# Patient Record
Sex: Female | Born: 1970
Health system: Southern US, Community
[De-identification: ages and names within clinical notes are randomized; demographics above are authoritative.]

## PROBLEM LIST (undated history)

## (undated) DIAGNOSIS — R569 Unspecified convulsions: Secondary | ICD-10-CM

## (undated) DIAGNOSIS — R6250 Unspecified lack of expected normal physiological development in childhood: Secondary | ICD-10-CM

## (undated) HISTORY — DX: Unspecified convulsions: R56.9

## (undated) HISTORY — DX: Unspecified lack of expected normal physiological development in childhood: R62.50

---

## 2015-04-25 DIAGNOSIS — G40309 Generalized idiopathic epilepsy and epileptic syndromes, not intractable, without status epilepticus: Secondary | ICD-10-CM | POA: Diagnosis not present

## 2015-04-25 DIAGNOSIS — G43709 Chronic migraine without aura, not intractable, without status migrainosus: Secondary | ICD-10-CM | POA: Diagnosis not present

## 2015-05-10 DIAGNOSIS — Z1231 Encounter for screening mammogram for malignant neoplasm of breast: Secondary | ICD-10-CM | POA: Diagnosis not present

## 2015-05-10 DIAGNOSIS — Z803 Family history of malignant neoplasm of breast: Secondary | ICD-10-CM | POA: Diagnosis not present

## 2015-12-14 DIAGNOSIS — J309 Allergic rhinitis, unspecified: Secondary | ICD-10-CM | POA: Diagnosis not present

## 2015-12-14 DIAGNOSIS — G40909 Epilepsy, unspecified, not intractable, without status epilepticus: Secondary | ICD-10-CM | POA: Diagnosis not present

## 2015-12-14 DIAGNOSIS — R4183 Borderline intellectual functioning: Secondary | ICD-10-CM | POA: Diagnosis not present

## 2015-12-14 DIAGNOSIS — Z23 Encounter for immunization: Secondary | ICD-10-CM | POA: Diagnosis not present

## 2016-06-06 ENCOUNTER — Encounter (INDEPENDENT_AMBULATORY_CARE_PROVIDER_SITE_OTHER): Payer: Self-pay | Admitting: Physician Assistant

## 2016-06-06 ENCOUNTER — Ambulatory Visit (INDEPENDENT_AMBULATORY_CARE_PROVIDER_SITE_OTHER): Payer: Medicare Other | Admitting: Physician Assistant

## 2016-06-06 VITALS — BP 119/74 | HR 73 | Temp 98.7°F | Ht 64.57 in | Wt 141.4 lb

## 2016-06-06 DIAGNOSIS — Z760 Encounter for issue of repeat prescription: Secondary | ICD-10-CM

## 2016-06-06 DIAGNOSIS — R569 Unspecified convulsions: Secondary | ICD-10-CM

## 2016-06-06 MED ORDER — DIVALPROEX SODIUM 500 MG PO DR TAB: 500.0000 mg | DELAYED_RELEASE_TABLET | Freq: Two times a day (BID) | ORAL | 1 refills | Status: DC

## 2016-06-06 MED ORDER — OXCARBAZEPINE 600 MG PO TABS: 600.0000 mg | ORAL_TABLET | Freq: Two times a day (BID) | ORAL | 1 refills | Status: DC

## 2016-06-06 MED ORDER — LEVETIRACETAM 1000 MG PO TABS: 1000.0000 mg | ORAL_TABLET | Freq: Two times a day (BID) | ORAL | 1 refills | Status: DC

## 2016-06-06 NOTE — Progress Notes (Signed)
  Subjective:  Patient ID: Rebecca Gregory, female    DOB: 06/27/1970  Age: 46 y.o. MRN: [id number]  CC: establish care   HPI Rebecca Gregory is a 46 y.o. female with a reported hx of Grand Mal seizures presents to establish care. Pt is accompanied by mother and father. [place] have [place]. Pt previously followed by neurologist regularly but will now need a referral to a neurologist here. They would also like a refill of her medications. Father states the combination of Keppra, Trileptal, and Depakote have drastically reduced seizures. Most patient would experience while on these medications is a petite mal seizure of which patient is aware of having. There are currently no other complaints or symptoms.   Review of Systems  Constitutional: Negative for chills, fever and malaise/fatigue.  Eyes: Negative for blurred vision.  Respiratory: Negative for shortness of breath.   Cardiovascular: Negative for chest pain and palpitations.  Gastrointestinal: Negative for abdominal pain and nausea.  Genitourinary: Negative for dysuria and hematuria.  Musculoskeletal: Negative for joint pain and myalgias.  Skin: Negative for rash.  Neurological: Positive for seizures. Negative for tingling and headaches.  Psychiatric/Behavioral: Negative for depression. The patient is not nervous/anxious.     Objective:  BP 119/74 (BP Location: Right Arm, Patient Position: Sitting, Cuff Size: Normal)   Pulse 73   Temp 98.7 F (37.1 C) (Oral)   Ht 5' 4.57" (1.64 m)   Wt 141 lb 6.4 oz (64.1 kg)   LMP 05/09/2016 (Exact Date)   SpO2 100%   BMI 23.85 kg/m   BP/Weight 05/10/6008  Systolic BP 932  Diastolic BP 74  Wt. (Lbs) 141.4  BMI 23.85      Physical Exam  Constitutional: She is oriented to person, place, and time.  Well developed, well nourished, NAD, polite,   HENT:  Head: Normocephalic and atraumatic.  Eyes: No scleral icterus.  Neck: Normal range of motion. Neck supple. No  thyromegaly present.  Cardiovascular: Normal rate, regular rhythm and normal heart sounds.   Pulmonary/Chest: Effort normal and breath sounds normal.  Abdominal: Soft. Bowel sounds are normal. There is no tenderness.  Musculoskeletal: She exhibits no edema.  Neurological: She is alert and oriented to person, place, and time. Coordination normal.  mild cognitive impairment. Muscular tone and strength mildly decreased in UEs and LEs. Mild to moderate impairment of speech. Patellar DTR 1+ bilaterally.  Skin: Skin is warm and dry. No rash noted. No erythema. No pallor.  Psychiatric: She has a normal mood and affect. Her behavior is normal. Thought content normal.  Vitals reviewed.    Assessment & Plan:   1. Seizures ([place]) - Seemingly well controlled on Keppra, Trileptal, and Depakote. - CBC with Differential - Comprehensive metabolic panel - Ambulatory referral to Neurology  2. Medication refill    Follow-up:  4 weeks for full physical  Clent Demark PA

## 2016-06-20 ENCOUNTER — Encounter (INDEPENDENT_AMBULATORY_CARE_PROVIDER_SITE_OTHER): Payer: Self-pay | Admitting: Physician Assistant

## 2016-06-20 ENCOUNTER — Other Ambulatory Visit (HOSPITAL_COMMUNITY)
Admission: RE | Admit: 2016-06-20 | Discharge: 2016-06-20 | Disposition: A | Discharge status: Home / Self Care | Payer: Medicare Other | Source: Ambulatory Visit | Attending: Physician Assistant | Admitting: Physician Assistant

## 2016-06-20 ENCOUNTER — Ambulatory Visit (INDEPENDENT_AMBULATORY_CARE_PROVIDER_SITE_OTHER): Payer: Medicare Other | Admitting: Physician Assistant

## 2016-06-20 VITALS — BP 124/73 | HR 63 | Temp 97.5°F | Ht 64.0 in | Wt 144.0 lb

## 2016-06-20 DIAGNOSIS — Z23 Encounter for immunization: Secondary | ICD-10-CM | POA: Diagnosis not present

## 2016-06-20 DIAGNOSIS — Z124 Encounter for screening for malignant neoplasm of cervix: Secondary | ICD-10-CM | POA: Diagnosis not present

## 2016-06-20 DIAGNOSIS — Z1231 Encounter for screening mammogram for malignant neoplasm of breast: Secondary | ICD-10-CM | POA: Diagnosis not present

## 2016-06-20 DIAGNOSIS — Z0000 Encounter for general adult medical examination without abnormal findings: Secondary | ICD-10-CM

## 2016-06-20 LAB — POCT URINALYSIS DIPSTICK
Bilirubin, UA: NEGATIVE
Blood, UA: NEGATIVE
GLUCOSE UA: NEGATIVE
Ketones, UA: NEGATIVE
Leukocytes, UA: NEGATIVE
Nitrite, UA: NEGATIVE
Protein, UA: NEGATIVE
SPEC GRAV UA: 1.01 (ref 1.010–1.025)
UROBILINOGEN UA: 0.2 U/dL
pH, UA: 7 (ref 5.0–8.0)

## 2016-06-20 NOTE — Patient Instructions (Signed)

## 2016-06-20 NOTE — Progress Notes (Signed)
   Subjective:  Patient ID: Rebecca Gregory, female    DOB: 04-03-1970  Age: 47 y.o. MRN: [id number]  CC: annual physical  HPI Diane Hanel is a 46 y.o. female with a PMH of seizures presents for annual physical. Feels well. Has an appointment with neurology for her seizures next Tuesday 06/26/16. No complaints, not sexually active.  ROS Review of Systems  Constitutional: Negative for chills, fever and malaise/fatigue.  Eyes: Negative for blurred vision.  Respiratory: Negative for shortness of breath.   Cardiovascular: Negative for chest pain and palpitations.  Gastrointestinal: Negative for abdominal pain and nausea.  Genitourinary: Negative for dysuria and hematuria.  Musculoskeletal: Negative for joint pain and myalgias.  Skin: Negative for rash.  Neurological: Positive for seizures. Negative for tingling and headaches.  Psychiatric/Behavioral: Negative for depression. The patient is not nervous/anxious.     Objective:  BP 124/73 (BP Location: Right Arm, Patient Position: Sitting, Cuff Size: Normal)   Pulse 63   Temp 97.5 F (36.4 C) (Oral)   Ht 5\' 4"  (1.626 m)   Wt 144 lb (65.3 kg)   LMP 05/23/2016 (Approximate)   SpO2 97%   BMI 24.72 kg/m   BP/Weight 06/20/2016 9/74/1638  Systolic BP 453 646  Diastolic BP 73 74  Wt. (Lbs) 144 141.4  BMI 24.72 23.85      Physical Exam  Constitutional: She is oriented to person, place, and time.  Well developed, well nourished, NAD, polite  HENT:  Head: Normocephalic and atraumatic.  Eyes: No scleral icterus.  Neck: Normal range of motion. Neck supple. No thyromegaly present.  Cardiovascular: Normal rate, regular rhythm and normal heart sounds.   Pulmonary/Chest: Effort normal and breath sounds normal.  Abdominal: Soft. Bowel sounds are normal. There is no tenderness.  Genitourinary: Vagina normal and uterus normal. No vaginal discharge found.  Genitourinary Comments: Cervix normal with no motion tenderness. No adnexal tenderness  bilaterally.  Musculoskeletal: She exhibits no edema.  Neurological: She is alert and oriented to person, place, and time.  Skin: Skin is warm and dry. No rash noted. No erythema. No pallor.  Psychiatric: She has a normal mood and affect. Her behavior is normal. Thought content normal.  Vitals reviewed.    Assessment & Plan:   1. Physical exam, routine - Comprehensive metabolic panel - Cytology - PAP - POCT urinalysis dipstick negative in clinic today. - CBC  and Lipids have been rejected for coverage by Medicare/Medicaid under this diagnosis code. I have contacted supervisor to help with this issue.  Follow-up: Return if symptoms worsen or fail to improve.   Clent Demark PA

## 2016-06-20 NOTE — Progress Notes (Signed)
Pt presents today for a physical  Pt denies pain today  Pt consents to TDAP vaccine today

## 2016-06-20 NOTE — Addendum Note (Signed)
Addended by: Nat Christen on: 06/20/2016 03:29 PM   Modules accepted: Orders

## 2016-06-21 ENCOUNTER — Other Ambulatory Visit: Payer: Self-pay

## 2016-06-21 ENCOUNTER — Other Ambulatory Visit (INDEPENDENT_AMBULATORY_CARE_PROVIDER_SITE_OTHER): Payer: Self-pay | Admitting: Physician Assistant

## 2016-06-21 DIAGNOSIS — Z1231 Encounter for screening mammogram for malignant neoplasm of breast: Secondary | ICD-10-CM

## 2016-06-21 LAB — COMPREHENSIVE METABOLIC PANEL
A/G RATIO: 1.5 (ref 1.2–2.2)
ALT: 9 IU/L (ref 0–32)
AST: 15 IU/L (ref 0–40)
Albumin: 4.6 g/dL (ref 3.5–5.5)
Alkaline Phosphatase: 46 IU/L (ref 39–117)
BUN/Creatinine Ratio: 16 (ref 9–23)
BUN: 10 mg/dL (ref 6–24)
Bilirubin Total: 0.3 mg/dL (ref 0.0–1.2)
CALCIUM: 9.9 mg/dL (ref 8.7–10.2)
CHLORIDE: 96 mmol/L (ref 96–106)
CO2: 27 mmol/L (ref 18–29)
Creatinine, Ser: 0.62 mg/dL (ref 0.57–1.00)
GFR, EST AFRICAN AMERICAN: 128 mL/min/{1.73_m2} (ref >59)
GFR, EST NON AFRICAN AMERICAN: 111 mL/min/{1.73_m2} (ref >59)
Globulin, Total: 3 g/dL (ref 1.5–4.5)
Glucose: 84 mg/dL (ref 65–99)
POTASSIUM: 4.7 mmol/L (ref 3.5–5.2)
Sodium: 137 mmol/L (ref 134–144)
TOTAL PROTEIN: 7.6 g/dL (ref 6.0–8.5)

## 2016-06-22 LAB — CYTOLOGY - PAP: DIAGNOSIS: NEGATIVE

## 2016-06-26 ENCOUNTER — Telehealth: Payer: Self-pay | Admitting: *Deleted

## 2016-06-26 ENCOUNTER — Ambulatory Visit: Payer: Medicare Other | Admitting: Neurology

## 2016-06-26 NOTE — Telephone Encounter (Signed)
Left message letting her know that our office is without power.  Provided our number to call back to reschedule new patient appt.

## 2016-07-11 ENCOUNTER — Ambulatory Visit (INDEPENDENT_AMBULATORY_CARE_PROVIDER_SITE_OTHER): Payer: Medicare Other | Admitting: Diagnostic Neuroimaging

## 2016-07-11 ENCOUNTER — Encounter: Payer: Self-pay | Admitting: Diagnostic Neuroimaging

## 2016-07-11 VITALS — BP 112/68 | HR 66 | Ht 65.0 in | Wt 138.8 lb

## 2016-07-11 DIAGNOSIS — R6250 Unspecified lack of expected normal physiological development in childhood: Secondary | ICD-10-CM | POA: Diagnosis not present

## 2016-07-11 DIAGNOSIS — R569 Unspecified convulsions: Secondary | ICD-10-CM | POA: Diagnosis not present

## 2016-07-11 DIAGNOSIS — Z760 Encounter for issue of repeat prescription: Secondary | ICD-10-CM | POA: Diagnosis not present

## 2016-07-11 DIAGNOSIS — G40909 Epilepsy, unspecified, not intractable, without status epilepticus: Secondary | ICD-10-CM | POA: Diagnosis not present

## 2016-07-11 MED ORDER — OXCARBAZEPINE 600 MG PO TABS: 600.0000 mg | ORAL_TABLET | Freq: Two times a day (BID) | ORAL | 4 refills | Status: DC

## 2016-07-11 MED ORDER — LEVETIRACETAM 1000 MG PO TABS: 1000.0000 mg | ORAL_TABLET | Freq: Two times a day (BID) | ORAL | 4 refills | Status: DC

## 2016-07-11 MED ORDER — DIVALPROEX SODIUM 500 MG PO DR TAB: 500.0000 mg | DELAYED_RELEASE_TABLET | Freq: Two times a day (BID) | ORAL | 4 refills | Status: DC

## 2016-07-11 NOTE — Progress Notes (Signed)
[facility]  PATIENT: Rebecca Gregory DOB: Sep 06, 1970  REFERRING CLINICIAN: Link Snuffer HISTORY FROM: patient's mother, father, and patient REASON FOR VISIT: new consult    HISTORICAL  CHIEF COMPLAINT:  Chief Complaint  Patient presents with  . Seizures    rm 7, New Pt, parents- Mr/Mrs Nicki Reaper, "seizures since 2002- no injury assoc with onset"    HISTORY OF PRESENT ILLNESS:   46 year old female here for evaluation of seizure disorder. Patient has history of developmental delay. In 2002 she had first seizure of her life, with generalized convulsions, tonic-clonic, with incontinence. Patient went to the hospital for evaluation. She had a second seizure within 1 month and was started on medication. Parents think she was started on Dilantin at that time. Over the years a chin transition to other antiseizure medications and currently takes oxcarbazepine, Depakote, levetiracetam. Since that time she is doing well. She has had one grand mal seizure in March 2018. Patient has had 5 total grand mal seizures in her life. She does have 1-2 "mini seizures" per month where she has staring, jittery sensation, abnormal mouth movements, makes a grunting noise or sound. These typically proceed her menstrual cycle.  Patient and her family used to live in [place] and recently moved to [place] in November 2017.   REVIEW OF SYSTEMS: Full 14 system review of systems performed and negative with exception of: Seizure disorder.  ALLERGIES: No Known Allergies  HOME MEDICATIONS: Outpatient Medications Prior to Visit  Medication Sig Dispense Refill  . acetaminophen (TYLENOL) 500 MG tablet Take 500 mg by mouth every 8 (eight) hours as needed for headache.    . divalproex (DEPAKOTE) 500 MG DR tablet Take 1 tablet (500 mg total) by mouth 2 (two) times daily. 180 tablet 1  . Fluticasone-Salmeterol (ADVAIR) 500-50 MCG/DOSE AEPB Inhale 1 puff into the lungs 2 (two) times daily.    Marland Kitchen ibuprofen  (ADVIL,MOTRIN) 200 MG tablet Take 500 mg by mouth every 8 (eight) hours as needed.    . levETIRAcetam (KEPPRA) 1000 MG tablet Take 1 tablet (1,000 mg total) by mouth 2 (two) times daily. 180 tablet 1  . oxcarbazepine (TRILEPTAL) 600 MG tablet Take 1 tablet (600 mg total) by mouth 2 (two) times daily. 180 tablet 1   No facility-administered medications prior to visit.     PAST MEDICAL HISTORY: Past Medical History:  Diagnosis Date  . Developmental delay   . Seizures ([place])    since 2002    PAST SURGICAL HISTORY: History reviewed. No pertinent surgical history.  FAMILY HISTORY: History reviewed. No pertinent family history.  SOCIAL HISTORY:  Social History   Social History  . Marital status: Single    Spouse name: N/A  . Number of children: 0  . Years of education: 12   Occupational History  .      NA   Social History Main Topics  . Smoking status: Never Smoker  . Smokeless tobacco: Never Used  . Alcohol use No  . Drug use: No  . Sexual activity: Not on file   Other Topics Concern  . Not on file   Social History Narrative   Lives with parents   No caffeine     PHYSICAL EXAM  GENERAL EXAM/CONSTITUTIONAL: Vitals:  Vitals:   07/11/16 1248  BP: 112/68  Pulse: 66  Weight: 138 lb 12.8 oz (63 kg)  Height: 5\' 5"  (1.651 m)     Body mass index is 23.1 kg/m.  Visual Acuity Screening  Right eye Left eye Both eyes  Without correction: 20/100 20/50   With correction:        Patient is in no distress; well developed, nourished and groomed; neck is supple  SOFT SPOKEN  SMILING  SLIGHTLY DECREASED EYE CONTACT  CARDIOVASCULAR:  Examination of carotid arteries is normal; no carotid bruits  Regular rate and rhythm, no murmurs  Examination of peripheral vascular system by observation and palpation is normal  EYES:  Ophthalmoscopic exam of optic discs and posterior segments is normal; no papilledema or hemorrhages  MUSCULOSKELETAL:  Gait,  strength, tone, movements noted in Neurologic exam below  NEUROLOGIC: MENTAL STATUS:  No flowsheet data found.  awake, alert, oriented to person  recent and remote memory intact  DECR attention and concentration  DECR FLUENCY, comprehension intact, naming intact,   fund of knowledge appropriate  CRANIAL NERVE:   2nd - no papilledema on fundoscopic exam  2nd, 3rd, 4th, 6th - pupils equal and reactive to light, visual fields full to confrontation, extraocular muscles intact, no nystagmus  5th - facial sensation symmetric  7th - facial strength symmetric  8th - hearing intact  9th - palate elevates symmetrically, uvula midline  11th - shoulder shrug symmetric  12th - tongue protrusion midline  MOTOR:   normal bulk and tone, full strength in the BUE, BLE  SENSORY:   normal and symmetric to light touch, temperature, vibration  COORDINATION:   finger-nose-finger, fine finger movements normal  REFLEXES:   deep tendon reflexes present and symmetric  GAIT/STATION:   narrow based gait    DIAGNOSTIC DATA (LABS, IMAGING, TESTING) - I reviewed patient records, labs, notes, testing and imaging myself where available.  No results found for: WBC, HGB, HCT, MCV, PLT    Component Value Date/Time   NA 137 06/20/2016 1513   K 4.7 06/20/2016 1513   CL 96 06/20/2016 1513   CO2 27 06/20/2016 1513   GLUCOSE 84 06/20/2016 1513   BUN 10 06/20/2016 1513   CREATININE 0.62 06/20/2016 1513   CALCIUM 9.9 06/20/2016 1513   PROT 7.6 06/20/2016 1513   ALBUMIN 4.6 06/20/2016 1513   AST 15 06/20/2016 1513   ALT 9 06/20/2016 1513   ALKPHOS 46 06/20/2016 1513   BILITOT 0.3 06/20/2016 1513   GFRNONAA 111 06/20/2016 1513   GFRAA 128 06/20/2016 1513   No results found for: CHOL, HDL, LDLCALC, LDLDIRECT, TRIG, CHOLHDL No results found for: HGBA1C No results found for: VITAMINB12 No results found for: TSH     ASSESSMENT AND PLAN  46 y.o. year old female here with  Developmental delay and seizure disorder here to establish care with local neurologist in [place]. Patient doing well on current regimen of medications. Will refill medications and request records from prior neurologist from [place].   Dx:  1. Seizure disorder ([place])   2. Developmental delay      PLAN: - continue divalproex 500mg  twice a day - continue levetiracetam 1000mg  twice a day - continue oxcarbazepine 600mg  twice a day - check CBC, CMP annually (per PCP)  Meds ordered this encounter  Medications  . oxcarbazepine (TRILEPTAL) 600 MG tablet    Sig: Take 1 tablet (600 mg total) by mouth 2 (two) times daily.    Dispense:  180 tablet    Refill:  4  . levETIRAcetam (KEPPRA) 1000 MG tablet    Sig: Take 1 tablet (1,000 mg total) by mouth 2 (two) times daily.    Dispense:  180 tablet  Refill:  4  . divalproex (DEPAKOTE) 500 MG DR tablet    Sig: Take 1 tablet (500 mg total) by mouth 2 (two) times daily.    Dispense:  180 tablet    Refill:  4   Return in about 6 months (around 01/11/2017).    Penni Bombard, MD 10/16/2156, 7:27 PM Certified in Neurology, Neurophysiology and Neuroimaging  [facility] [address], [place], [place] [address] [phone]

## 2016-07-13 ENCOUNTER — Ambulatory Visit
Admission: RE | Admit: 2016-07-13 | Discharge: 2016-07-13 | Disposition: A | Discharge status: Home / Self Care | Payer: Medicare Other | Source: Ambulatory Visit | Attending: Physician Assistant | Admitting: Physician Assistant

## 2016-07-13 DIAGNOSIS — Z1231 Encounter for screening mammogram for malignant neoplasm of breast: Secondary | ICD-10-CM

## 2017-01-14 ENCOUNTER — Ambulatory Visit: Payer: Medicare Other | Admitting: Diagnostic Neuroimaging

## 2017-01-22 ENCOUNTER — Telehealth (INDEPENDENT_AMBULATORY_CARE_PROVIDER_SITE_OTHER): Payer: Self-pay | Admitting: Physician Assistant

## 2017-01-22 NOTE — Telephone Encounter (Signed)
Pt's father Rebecca Gregory is calling requesting antibiotic due to her Gum Disease  Her dentist recommend that and she has an appointment  With dentist after December 1 due to her insurance start  . Pharmacy [facility] [place] [facility]   Please, call and let them know thank you

## 2017-01-24 ENCOUNTER — Telehealth (INDEPENDENT_AMBULATORY_CARE_PROVIDER_SITE_OTHER): Payer: Self-pay | Admitting: Physician Assistant

## 2017-01-24 ENCOUNTER — Other Ambulatory Visit (INDEPENDENT_AMBULATORY_CARE_PROVIDER_SITE_OTHER): Payer: Self-pay | Admitting: Physician Assistant

## 2017-01-24 DIAGNOSIS — K0889 Other specified disorders of teeth and supporting structures: Secondary | ICD-10-CM

## 2017-01-24 MED ORDER — AMOXICILLIN-POT CLAVULANATE 875-125 MG PO TABS: 1.0000 | ORAL_TABLET | Freq: Two times a day (BID) | ORAL | 0 refills | Status: DC

## 2017-01-24 NOTE — Telephone Encounter (Signed)
Pt's father Rebecca Gregory is calling requesting antibiotic due to her Gum Disease  Her dentist recommend that and  To contact her pcp she has an appointment  With dentist after December 1 due to her insurance start  . Pharmacy [facility] [place] [facility] [facility]   Please, call and let them know thank you

## 2017-02-19 ENCOUNTER — Ambulatory Visit: Payer: Medicare Other | Admitting: Diagnostic Neuroimaging

## 2017-03-04 ENCOUNTER — Other Ambulatory Visit (INDEPENDENT_AMBULATORY_CARE_PROVIDER_SITE_OTHER): Payer: Self-pay | Admitting: Physician Assistant

## 2017-03-07 NOTE — Telephone Encounter (Signed)
FWD to PCP. Nattaly Yebra S Lannie Heaps, CMA  

## 2017-03-20 ENCOUNTER — Encounter: Payer: Self-pay | Admitting: Diagnostic Neuroimaging

## 2017-03-20 ENCOUNTER — Ambulatory Visit (INDEPENDENT_AMBULATORY_CARE_PROVIDER_SITE_OTHER): Payer: Medicare Other | Admitting: Diagnostic Neuroimaging

## 2017-03-20 DIAGNOSIS — Z760 Encounter for issue of repeat prescription: Secondary | ICD-10-CM | POA: Diagnosis not present

## 2017-03-20 DIAGNOSIS — R569 Unspecified convulsions: Secondary | ICD-10-CM | POA: Diagnosis not present

## 2017-03-20 MED ORDER — DIVALPROEX SODIUM 500 MG PO DR TAB: 500.0000 mg | DELAYED_RELEASE_TABLET | Freq: Two times a day (BID) | ORAL | 4 refills | Status: DC

## 2017-03-20 MED ORDER — OXCARBAZEPINE 600 MG PO TABS: 600.0000 mg | ORAL_TABLET | Freq: Two times a day (BID) | ORAL | 4 refills | Status: DC

## 2017-03-20 MED ORDER — LEVETIRACETAM 1000 MG PO TABS: 1000.0000 mg | ORAL_TABLET | Freq: Two times a day (BID) | ORAL | 4 refills | Status: DC

## 2017-03-20 NOTE — Progress Notes (Signed)
[facility]  PATIENT: Rebecca Gregory DOB: January 06, 1971  REFERRING CLINICIAN: Link Snuffer HISTORY FROM: patient's father and patient REASON FOR VISIT: follow up    HISTORICAL  CHIEF COMPLAINT:  Chief Complaint  Patient presents with  . Follow-up  . Seizures    has petit sz prior to her m cycle. (every month per father).  NO grandmal sz.      HISTORY OF PRESENT ILLNESS:   UPDATE (03/20/17, VRP): Since last visit, doing well. Tolerating meds. No alleviating or aggravating factors. Small petit mal seizures continue prior to menstrual cycle. Some mild intermittent tremor.   PRIOR HPI (07/11/16): 47 year old female here for evaluation of seizure disorder. Patient has history of developmental delay. In 2002 she had first seizure of her life, with generalized convulsions, tonic-clonic, with incontinence. Patient went to the hospital for evaluation. She had a second seizure within 1 month and was started on medication. Parents think she was started on Dilantin at that time. Over the years a chin transition to other antiseizure medications and currently takes oxcarbazepine, Depakote, levetiracetam. Since that time she is doing well. She has had one grand mal seizure in March 2018. Patient has had 5 total grand mal seizures in her life. She does have 1-2 "mini seizures" per month where she has staring, jittery sensation, abnormal mouth movements, makes a grunting noise or sound. These typically proceed her menstrual cycle.  Patient and her family used to live in [place] and recently moved to [place] in November 2017.   REVIEW OF SYSTEMS: Full 14 system review of systems performed and negative with exception of: seizure tremor env allergies.   ALLERGIES: No Known Allergies  HOME MEDICATIONS: Outpatient Medications Prior to Visit  Medication Sig Dispense Refill  . acetaminophen (TYLENOL) 500 MG tablet Take 500 mg by mouth every 8 (eight) hours as needed for headache.    Marland Kitchen  amoxicillin-clavulanate (AUGMENTIN) 875-125 MG tablet TAKE 1 TABLET TWICE A DAY 20 tablet 0  . divalproex (DEPAKOTE) 500 MG DR tablet Take 1 tablet (500 mg total) by mouth 2 (two) times daily. 180 tablet 4  . Fluticasone-Salmeterol (ADVAIR) 500-50 MCG/DOSE AEPB Inhale 1 puff into the lungs 2 (two) times daily.    Marland Kitchen ibuprofen (ADVIL,MOTRIN) 800 MG tablet Take 800 mg by mouth every 8 (eight) hours as needed.    . levETIRAcetam (KEPPRA) 1000 MG tablet Take 1 tablet (1,000 mg total) by mouth 2 (two) times daily. 180 tablet 4  . oxcarbazepine (TRILEPTAL) 600 MG tablet Take 1 tablet (600 mg total) by mouth 2 (two) times daily. 180 tablet 4  . ibuprofen (ADVIL,MOTRIN) 200 MG tablet Take 500 mg by mouth every 8 (eight) hours as needed.     No facility-administered medications prior to visit.     PAST MEDICAL HISTORY: Past Medical History:  Diagnosis Date  . Developmental delay   . Seizures ([place])    since 2002    PAST SURGICAL HISTORY: No past surgical history on file.  FAMILY HISTORY: No family history on file.  SOCIAL HISTORY:  Social History   Socioeconomic History  . Marital status: Single    Spouse name: Not on file  . Number of children: 0  . Years of education: 23  . Highest education level: Not on file  Social Needs  . Financial resource strain: Not on file  . Food insecurity - worry: Not on file  . Food insecurity - inability: Not on file  . Transportation needs - medical: Not on  file  . Transportation needs - non-medical: Not on file  Occupational History    Comment: NA  Tobacco Use  . Smoking status: Never Smoker  . Smokeless tobacco: Never Used  Substance and Sexual Activity  . Alcohol use: No  . Drug use: No  . Sexual activity: Not on file  Other Topics Concern  . Not on file  Social History Narrative   Lives with parents   No caffeine     PHYSICAL EXAM  GENERAL EXAM/CONSTITUTIONAL: Vitals:  Vitals:   03/20/17 1245  BP: 112/64  Pulse: 66  Weight:  145 lb 9.6 oz (66 kg)  Height: 5\' 5"  (1.651 m)   Body mass index is 24.23 kg/m. No exam data present  Patient is in no distress; well developed, nourished and groomed; neck is supple  SOFT SPOKEN  SMILING  SLIGHTLY DECREASED EYE CONTACT  CARDIOVASCULAR:  Examination of carotid arteries is normal; no carotid bruits  Regular rate and rhythm, no murmurs  Examination of peripheral vascular system by observation and palpation is normal  EYES:  Ophthalmoscopic exam of optic discs and posterior segments is normal; no papilledema or hemorrhages  MUSCULOSKELETAL:  Gait, strength, tone, movements noted in Neurologic exam below  NEUROLOGIC: MENTAL STATUS:  No flowsheet data found.  awake, alert, oriented to person  recent and remote memory intact  DECR attention and concentration  DECR FLUENCY, comprehension intact, naming intact,   fund of knowledge appropriate  CRANIAL NERVE:   2nd - no papilledema on fundoscopic exam  2nd, 3rd, 4th, 6th - pupils equal and reactive to light, visual fields full to confrontation, extraocular muscles intact, no nystagmus  5th - facial sensation symmetric  7th - facial strength symmetric  8th - hearing intact  9th - palate elevates symmetrically, uvula midline  11th - shoulder shrug symmetric  12th - tongue protrusion midline  MOTOR:   normal bulk and tone, full strength in the BUE, BLE  SENSORY:   normal and symmetric to light touch, temperature, vibration  COORDINATION:   finger-nose-finger, fine finger movements normal  REFLEXES:   deep tendon reflexes present and symmetric  GAIT/STATION:   narrow based gait    DIAGNOSTIC DATA (LABS, IMAGING, TESTING) - I reviewed patient records, labs, notes, testing and imaging myself where available.  No results found for: WBC, HGB, HCT, MCV, PLT    Component Value Date/Time   NA 137 06/20/2016 1513   K 4.7 06/20/2016 1513   CL 96 06/20/2016 1513   CO2 27  06/20/2016 1513   GLUCOSE 84 06/20/2016 1513   BUN 10 06/20/2016 1513   CREATININE 0.62 06/20/2016 1513   CALCIUM 9.9 06/20/2016 1513   PROT 7.6 06/20/2016 1513   ALBUMIN 4.6 06/20/2016 1513   AST 15 06/20/2016 1513   ALT 9 06/20/2016 1513   ALKPHOS 46 06/20/2016 1513   BILITOT 0.3 06/20/2016 1513   GFRNONAA 111 06/20/2016 1513   GFRAA 128 06/20/2016 1513   No results found for: CHOL, HDL, LDLCALC, LDLDIRECT, TRIG, CHOLHDL No results found for: HGBA1C No results found for: VITAMINB12 No results found for: TSH     ASSESSMENT AND PLAN  47 y.o. year old female here with Developmental delay and seizure disorder here to establish care with local neurologist in [place]. Patient doing well on current regimen of medications. Will refill medications and request records from prior neurologist from [place].   Dx:  1. Seizures ([place])   2. Medication refill      PLAN:  I spent 15 minutes of face to face time with patient. Greater than 50% of time was spent in counseling and coordination of care with patient. In summary we discussed:   - continue divalproex 500mg  twice a day - continue levetiracetam 1000mg  twice a day - continue oxcarbazepine 600mg  twice a day - check CBC, CMP annually (per PCP)  Meds ordered this encounter  Medications  . oxcarbazepine (TRILEPTAL) 600 MG tablet    Sig: Take 1 tablet (600 mg total) by mouth 2 (two) times daily.    Dispense:  180 tablet    Refill:  4  . levETIRAcetam (KEPPRA) 1000 MG tablet    Sig: Take 1 tablet (1,000 mg total) by mouth 2 (two) times daily.    Dispense:  180 tablet    Refill:  4  . divalproex (DEPAKOTE) 500 MG DR tablet    Sig: Take 1 tablet (500 mg total) by mouth 2 (two) times daily.    Dispense:  180 tablet    Refill:  4   Return in about 6 months (around 09/17/2017) for with NP/PA or Jaizon Deroos.    Penni Bombard, MD 06/11/2818, 8:13 PM Certified in Neurology, Neurophysiology and Neuroimaging  [facility] [address] [place], [place] [address] [phone]

## 2017-05-02 ENCOUNTER — Ambulatory Visit (INDEPENDENT_AMBULATORY_CARE_PROVIDER_SITE_OTHER): Payer: Medicare Other | Admitting: Physician Assistant

## 2017-05-02 ENCOUNTER — Encounter (INDEPENDENT_AMBULATORY_CARE_PROVIDER_SITE_OTHER): Payer: Self-pay | Admitting: Physician Assistant

## 2017-05-02 VITALS — BP 118/68 | HR 67 | Temp 98.3°F | Resp 18 | Ht 64.0 in | Wt 154.0 lb

## 2017-05-02 DIAGNOSIS — Z23 Encounter for immunization: Secondary | ICD-10-CM | POA: Diagnosis not present

## 2017-05-02 DIAGNOSIS — H6121 Impacted cerumen, right ear: Secondary | ICD-10-CM

## 2017-05-02 DIAGNOSIS — Z114 Encounter for screening for human immunodeficiency virus [HIV]: Secondary | ICD-10-CM | POA: Diagnosis not present

## 2017-05-02 DIAGNOSIS — Z760 Encounter for issue of repeat prescription: Secondary | ICD-10-CM | POA: Diagnosis not present

## 2017-05-02 DIAGNOSIS — R42 Dizziness and giddiness: Secondary | ICD-10-CM

## 2017-05-02 MED ORDER — CHLORHEXIDINE GLUCONATE 0.12 % MT SOLN: 15.0000 mL | Freq: Two times a day (BID) | OROMUCOSAL | 2 refills | Status: DC

## 2017-05-02 MED ORDER — CARBAMIDE PEROXIDE 6.5 % OT SOLN: 5.0000 [drp] | Freq: Two times a day (BID) | OTIC | 0 refills | Status: DC

## 2017-05-02 NOTE — Progress Notes (Signed)
Subjective:  Patient ID: [name], female    DOB: 1970/11/17  Age: 47 y.o. MRN: [id number]  CC: lightheadedness   HPI  Rebecca Gregory is a 47 y.o. female with a PMH of seizures presents with mild lightheadedness on a daily basis except for today. Onset one week ago that has occurred daily except for today. Lightheadedness both with sitting position and when transitioning to a standing position from a seated or supine position. Does not endorse prior cold/flu, ear pain, tinnitus, loss of audition, f/c/n/v, dysuria, HA, CP, palpations, SOB, abdominal pain, rash, or GI/GU sxs. Would like a refill of peridex as this has helped her tooth and gum pain.     Outpatient Medications Prior to Visit  Medication Sig Dispense Refill  . acetaminophen (TYLENOL) 500 MG tablet Take 500 mg by mouth every 8 (eight) hours as needed for headache.    . chlorhexidine (PERIDEX) 0.12 % solution Use as directed 15 mLs in the mouth or throat 2 (two) times daily.    . divalproex (DEPAKOTE) 500 MG DR tablet Take 1 tablet (500 mg total) by mouth 2 (two) times daily. 180 tablet 4  . Fluticasone-Salmeterol (ADVAIR) 500-50 MCG/DOSE AEPB Inhale 1 puff into the lungs 2 (two) times daily.    Marland Kitchen ibuprofen (ADVIL,MOTRIN) 800 MG tablet Take 800 mg by mouth every 8 (eight) hours as needed.    . levETIRAcetam (KEPPRA) 1000 MG tablet Take 1 tablet (1,000 mg total) by mouth 2 (two) times daily. 180 tablet 4  . oxcarbazepine (TRILEPTAL) 600 MG tablet Take 1 tablet (600 mg total) by mouth 2 (two) times daily. 180 tablet 4  . amoxicillin-clavulanate (AUGMENTIN) 875-125 MG tablet TAKE 1 TABLET TWICE A DAY 20 tablet 0   No facility-administered medications prior to visit.      ROS Review of Systems  Constitutional: Negative for chills, fever and malaise/fatigue.  Eyes: Negative for blurred vision.  Respiratory: Negative for shortness of breath.   Cardiovascular: Negative for chest pain and palpitations.  Gastrointestinal: Negative  for abdominal pain and nausea.  Genitourinary: Negative for dysuria and hematuria.  Musculoskeletal: Negative for joint pain and myalgias.  Skin: Negative for rash.  Neurological: Negative for tingling and headaches.       Light headedness.  Psychiatric/Behavioral: Negative for depression. The patient is not nervous/anxious.     Objective:  BP 118/68 (BP Location: Right Arm, Patient Position: Sitting, Cuff Size: Normal)   Pulse 67   Temp 98.3 F (36.8 C) (Oral)   Resp 18   Ht 5\' 4"  (1.626 m)   Wt 154 lb (69.9 kg)   LMP 04/06/2017   SpO2 100%   BMI 26.43 kg/m   BP/Weight 05/02/2017 09/09/6965 10/18/3808  Systolic BP 175 102 585  Diastolic BP 68 64 68  Wt. (Lbs) 154 145.6 138.8  BMI 26.43 24.23 23.1      Physical Exam  Constitutional: She is oriented to person, place, and time.  Well developed, well nourished, NAD, polite  HENT:  Head: Normocephalic and atraumatic.  Eyes: Conjunctivae are normal. No scleral icterus.  Neck: Normal range of motion. Neck supple. No thyromegaly present.  Cardiovascular: Normal rate, regular rhythm and normal heart sounds.  Pulmonary/Chest: Effort normal and breath sounds normal.  Abdominal: Soft. Bowel sounds are normal. There is no tenderness.  Musculoskeletal: She exhibits no edema.  Neurological: She is alert and oriented to person, place, and time. No cranial nerve deficit. Coordination normal.  Dix Hallpike negative bilaterally  Skin: Skin is  warm and dry. No rash noted. No erythema. No pallor.  Psychiatric: She has a normal mood and affect. Her behavior is normal. Thought content normal.  Vitals reviewed.    Assessment & Plan:   1. Lightheadedness - Pt asymptomatic today. Physical exam to include Marye Round not contributory. Reports proper hydration. Etiology unknown at this point. Will await labs.  - Urinalysis Dipstick negative - CBC with Differential - Comprehensive metabolic panel  2. Medication refill - Refill  chlorhexidine (PERIDEX) 0.12 % solution; Use as directed 15 mLs in the mouth or throat 2 (two) times daily.  Dispense: 473 mL; Refill: 2  3. Impacted cerumen of right ear - Begin carbamide peroxide (DEBROX) 6.5 % OTIC solution; Place 5 drops into the right ear 2 (two) times daily.  Dispense: 15 mL; Refill: 0  4. Need for prophylactic vaccination and inoculation against influenza - Flu Vaccine QUAD 6+ mos PF IM (Fluarix Quad PF)  5. Encounter for screening for HIV - HIV antibody   Meds ordered this encounter  Medications  . chlorhexidine (PERIDEX) 0.12 % solution    Sig: Use as directed 15 mLs in the mouth or throat 2 (two) times daily.    Dispense:  473 mL    Refill:  2    Order Specific Question:   Supervising Provider    Answer:   Tresa Garter [id number]  . carbamide peroxide (DEBROX) 6.5 % OTIC solution    Sig: Place 5 drops into the right ear 2 (two) times daily.    Dispense:  15 mL    Refill:  0    Order Specific Question:   Supervising Provider    Answer:   Tresa Garter [id number]    Follow-up: Return if symptoms worsen or fail to improve.   Clent Demark PA

## 2017-05-02 NOTE — Patient Instructions (Signed)

## 2017-05-03 ENCOUNTER — Telehealth (INDEPENDENT_AMBULATORY_CARE_PROVIDER_SITE_OTHER): Payer: Self-pay | Admitting: *Deleted

## 2017-05-03 LAB — CBC WITH DIFFERENTIAL/PLATELET
BASOS: 0 %
Basophils Absolute: 0 10*3/uL (ref 0.0–0.2)
EOS (ABSOLUTE): 0.2 10*3/uL (ref 0.0–0.4)
Eos: 3 %
HEMOGLOBIN: 12.4 g/dL (ref 11.1–15.9)
Hematocrit: 36.8 % (ref 34.0–46.6)
IMMATURE GRANS (ABS): 0 10*3/uL (ref 0.0–0.1)
Immature Granulocytes: 0 %
LYMPHS: 32 %
Lymphocytes Absolute: 1.6 10*3/uL (ref 0.7–3.1)
MCH: 29.2 pg (ref 26.6–33.0)
MCHC: 33.7 g/dL (ref 31.5–35.7)
MCV: 87 fL (ref 79–97)
MONOCYTES: 13 %
Monocytes Absolute: 0.6 10*3/uL (ref 0.1–0.9)
NEUTROS ABS: 2.6 10*3/uL (ref 1.4–7.0)
Neutrophils: 52 %
Platelets: 243 10*3/uL (ref 150–379)
RBC: 4.25 x10E6/uL (ref 3.77–5.28)
RDW: 13.5 % (ref 12.3–15.4)
WBC: 5 10*3/uL (ref 3.4–10.8)

## 2017-05-03 LAB — COMPREHENSIVE METABOLIC PANEL
A/G RATIO: 1.7 (ref 1.2–2.2)
ALBUMIN: 4.5 g/dL (ref 3.5–5.5)
ALT: 34 IU/L — ABNORMAL HIGH (ref 0–32)
AST: 26 IU/L (ref 0–40)
Alkaline Phosphatase: 45 IU/L (ref 39–117)
BUN / CREAT RATIO: 29 — AB (ref 9–23)
BUN: 14 mg/dL (ref 6–24)
CHLORIDE: 105 mmol/L (ref 96–106)
CO2: 23 mmol/L (ref 20–29)
Calcium: 9.1 mg/dL (ref 8.7–10.2)
Creatinine, Ser: 0.49 mg/dL — ABNORMAL LOW (ref 0.57–1.00)
GFR calc non Af Amer: 119 mL/min/{1.73_m2} (ref >59)
GFR, EST AFRICAN AMERICAN: 137 mL/min/{1.73_m2} (ref >59)
Globulin, Total: 2.7 g/dL (ref 1.5–4.5)
Glucose: 78 mg/dL (ref 65–99)
POTASSIUM: 4.4 mmol/L (ref 3.5–5.2)
Sodium: 140 mmol/L (ref 134–144)
TOTAL PROTEIN: 7.2 g/dL (ref 6.0–8.5)

## 2017-05-03 LAB — HIV ANTIBODY (ROUTINE TESTING W REFLEX): HIV Screen 4th Generation wRfx: NONREACTIVE

## 2017-05-03 NOTE — Telephone Encounter (Signed)
-----   Message from Clent Demark, PA-C sent at 05/03/2017  8:43 AM EST ----- Labs essentially normal.

## 2017-05-03 NOTE — Telephone Encounter (Signed)
Medical Assistant left message on patient's home and cell voicemail. Voicemail states to give a call back to [place] with [facility] at [phone]. Patient is aware of labs being normal

## 2017-05-04 LAB — POCT URINALYSIS DIPSTICK
BILIRUBIN UA: NEGATIVE
GLUCOSE UA: NEGATIVE
KETONES UA: NEGATIVE
Leukocytes, UA: NEGATIVE
Nitrite, UA: NEGATIVE
PH UA: 7 (ref 5.0–8.0)
Protein, UA: NEGATIVE
RBC UA: NEGATIVE
SPEC GRAV UA: 1.02 (ref 1.010–1.025)
UROBILINOGEN UA: 0.2 U/dL

## 2017-05-15 ENCOUNTER — Telehealth (INDEPENDENT_AMBULATORY_CARE_PROVIDER_SITE_OTHER): Payer: Self-pay | Admitting: Physician Assistant

## 2017-05-15 NOTE — Telephone Encounter (Signed)
FWD to PCP. Tempestt S Roberts, CMA  

## 2017-05-15 NOTE — Telephone Encounter (Signed)
PA Altamease Oiler Rx a solution to clear Mrs maxi carreras but her Medical Plan don't cover if you can change the medication or something over the counter. Please, call her father Sigurd Sos  Thank you

## 2017-05-15 NOTE — Telephone Encounter (Signed)
Debrox is OTC. Thank you.

## 2017-05-16 NOTE — Telephone Encounter (Signed)
Patients father is aware that they may purchase debrox OTC. Nat Christen, CMA

## 2017-06-17 ENCOUNTER — Other Ambulatory Visit (INDEPENDENT_AMBULATORY_CARE_PROVIDER_SITE_OTHER): Payer: Self-pay | Admitting: Physician Assistant

## 2017-06-17 DIAGNOSIS — Z139 Encounter for screening, unspecified: Secondary | ICD-10-CM

## 2017-06-17 DIAGNOSIS — Z0271 Encounter for disability determination: Secondary | ICD-10-CM

## 2017-07-15 ENCOUNTER — Ambulatory Visit
Admission: RE | Admit: 2017-07-15 | Discharge: 2017-07-15 | Disposition: A | Discharge status: Home / Self Care | Payer: Medicare Other | Source: Ambulatory Visit | Attending: Physician Assistant | Admitting: Physician Assistant

## 2017-07-15 DIAGNOSIS — R42 Dizziness and giddiness: Secondary | ICD-10-CM

## 2017-07-15 DIAGNOSIS — Z1231 Encounter for screening mammogram for malignant neoplasm of breast: Secondary | ICD-10-CM | POA: Diagnosis not present

## 2017-07-15 DIAGNOSIS — Z139 Encounter for screening, unspecified: Secondary | ICD-10-CM

## 2017-09-16 NOTE — Progress Notes (Signed)
[facility]  PATIENT: Rebecca Gregory DOB: 07-19-1970   REASON FOR VISIT: Follow-up for seizure disorder HISTORY FROM:DAD   HISTORY OF PRESENT ILLNESS:UPDATE 7/10/2019[name], 47 year old female returns for follow-up with a history of seizure disorder.  She continues to have small seizures with her menstrual cycles.  No grand mal seizures.  She remains on Depakote Keppra and Trileptal without side effects.  No new neurologic complaints.  Reviewed recent CBC and CMP from primary care 05/02/2017 WNL.  She returns for reevaluation   UPDATE (03/20/17, VRP): Since last visit, doing well. Tolerating meds. No alleviating or aggravating factors. Small petit mal seizures continue prior to menstrual cycle. Some mild intermittent tremor.   PRIOR HPI (07/11/16): 47 year old female here for evaluation of seizure disorder. Patient has history of developmental delay. In 2002 she had first seizure of her life, with generalized convulsions, tonic-clonic, with incontinence. Patient went to the hospital for evaluation. She had a second seizure within 1 month and was started on medication. Parents think she was started on Dilantin at that time. Over the years a chin transition to other antiseizure medications and currently takes oxcarbazepine, Depakote, levetiracetam. Since that time she is doing well. She has had one grand mal seizure in March 2018. Patient has had 5 total grand mal seizures in her life. She does have 1-2 "mini seizures" per month where she has staring, jittery sensation, abnormal mouth movements, makes a grunting noise or sound. These typically proceed her menstrual cycle.  Patient and her family used to live in [place] and recently moved to [place] in November 2017.    REVIEW OF SYSTEMS: Full 14 system review of systems performed and notable only for those listed, all others are neg:  Constitutional: neg  Cardiovascular: neg Ear/Nose/Throat: neg  Skin: neg Eyes:  neg Respiratory: neg Gastroitestinal: neg  Hematology/Lymphatic: neg  Endocrine: neg Musculoskeletal:neg Allergy/Immunology: neg Neurological: Seizure disorder Psychiatric: neg Sleep : neg   ALLERGIES: No Known Allergies  HOME MEDICATIONS: Outpatient Medications Prior to Visit  Medication Sig Dispense Refill  . acetaminophen (TYLENOL) 500 MG tablet Take 500 mg by mouth every 8 (eight) hours as needed for headache.    . carbamide peroxide (DEBROX) 6.5 % OTIC solution Place 5 drops into the right ear 2 (two) times daily. 15 mL 0  . divalproex (DEPAKOTE) 500 MG DR tablet Take 1 tablet (500 mg total) by mouth 2 (two) times daily. 180 tablet 4  . Fluticasone-Salmeterol (ADVAIR) 500-50 MCG/DOSE AEPB Inhale 1 puff into the lungs 2 (two) times daily.    Marland Kitchen ibuprofen (ADVIL,MOTRIN) 800 MG tablet Take 800 mg by mouth every 8 (eight) hours as needed.    . levETIRAcetam (KEPPRA) 1000 MG tablet Take 1 tablet (1,000 mg total) by mouth 2 (two) times daily. 180 tablet 4  . oxcarbazepine (TRILEPTAL) 600 MG tablet Take 1 tablet (600 mg total) by mouth 2 (two) times daily. 180 tablet 4  . chlorhexidine (PERIDEX) 0.12 % solution Use as directed 15 mLs in the mouth or throat 2 (two) times daily. 473 mL 2   No facility-administered medications prior to visit.     PAST MEDICAL HISTORY: Past Medical History:  Diagnosis Date  . Developmental delay   . Seizures ([place])    since 2002    PAST SURGICAL HISTORY: History reviewed. No pertinent surgical history.  FAMILY HISTORY: History reviewed. No pertinent family history.  SOCIAL HISTORY: Social History   Socioeconomic History  . Marital status: Single  Spouse name: Not on file  . Number of children: 0  . Years of education: 68  . Highest education level: Not on file  Occupational History    Comment: NA  Social Needs  . Financial resource strain: Not on file  . Food insecurity:    Worry: Not on file    Inability: Not on file  .  Transportation needs:    Medical: Not on file    Non-medical: Not on file  Tobacco Use  . Smoking status: Never Smoker  . Smokeless tobacco: Never Used  Substance and Sexual Activity  . Alcohol use: No  . Drug use: No  . Sexual activity: Not Currently  Lifestyle  . Physical activity:    Days per week: Not on file    Minutes per session: Not on file  . Stress: Not on file  Relationships  . Social connections:    Talks on phone: Not on file    Gets together: Not on file    Attends religious service: Not on file    Active member of club or organization: Not on file    Attends meetings of clubs or organizations: Not on file    Relationship status: Not on file  . Intimate partner violence:    Fear of current or ex partner: Not on file    Emotionally abused: Not on file    Physically abused: Not on file    Forced sexual activity: Not on file  Other Topics Concern  . Not on file  Social History Narrative   Lives with parents   No caffeine     PHYSICAL EXAM  Vitals:   09/18/17 1243  BP: (!) 100/57  Pulse: 67  Weight: 149 lb 6.4 oz (67.8 kg)  Height: 5\' 5"  (1.651 m)   Body mass index is 24.86 kg/m.  Generalized: Well developed, in no acute distress  Head: normocephalic and atraumatic,. Oropharynx benign  Neck: Supple,  Musculoskeletal: No deformity   Neurological examination   Mentation: Alert oriented to time, place, history taking. Attention span and concentration decreased.  Follows all commands speech and language fluent.   Cranial nerve II-XII: Pupils were equal round reactive to light extraocular movements were full, visual field were full on confrontational test. Facial sensation and strength were normal. hearing was intact to finger rubbing bilaterally. Uvula tongue midline. head turning and shoulder shrug were normal and symmetric.Tongue protrusion into cheek strength was normal. Motor: normal bulk and tone, full strength in the BUE, BLE,  Sensory: normal  and symmetric to light touch,  Coordination: finger-nose-finger,  no dysmetria Reflexes: Brachioradialis 2/2, biceps 2/2, triceps 2/2, patellar 2/2, Achilles 2/2, plantar responses were flexor bilaterally. Gait and Station: Rising up from seated position without assistance, normal stance,  moderate stride, good arm swing, smooth turning, able to perform tiptoe, and heel walking without difficulty. Tandem gait is steady  DIAGNOSTIC DATA (LABS, IMAGING, TESTING) - I reviewed patient records, labs, notes, testing and imaging myself where available.  Lab Results  Component Value Date   WBC 5.0 05/02/2017   HGB 12.4 05/02/2017   HCT 36.8 05/02/2017   MCV 87 05/02/2017   PLT 243 05/02/2017      Component Value Date/Time   NA 140 05/02/2017 1646   K 4.4 05/02/2017 1646   CL 105 05/02/2017 1646   CO2 23 05/02/2017 1646   GLUCOSE 78 05/02/2017 1646   BUN 14 05/02/2017 1646   CREATININE 0.49 (L) 05/02/2017 1646   CALCIUM 9.1  05/02/2017 1646   PROT 7.2 05/02/2017 1646   ALBUMIN 4.5 05/02/2017 1646   AST 26 05/02/2017 1646   ALT 34 (H) 05/02/2017 1646   ALKPHOS 45 05/02/2017 1646   BILITOT <0.2 05/02/2017 1646   GFRNONAA 119 05/02/2017 1646   GFRAA 137 05/02/2017 1646     ASSESSMENT AND PLAN  47 y.o. year old female  has a past medical history of Developmental delay and Seizures ([place]). here to follow-up for seizure disorder.  She continues to have very mild seizures during her menstrual cycle    PLAN:Continue divalproex 500mg  twice a day Continue levetiracetam 1000mg  twice a day Continue oxcarbazepine 600mg  twice a day Reviewed CBC CMP from primary care 05/02/2017 within normal limits Dennie Bible, [facility], [facility], APRN  [facility] [address], [place], [place] [address] [phone]

## 2017-09-18 ENCOUNTER — Encounter: Payer: Self-pay | Admitting: Nurse Practitioner

## 2017-09-18 ENCOUNTER — Ambulatory Visit (INDEPENDENT_AMBULATORY_CARE_PROVIDER_SITE_OTHER): Payer: Medicare Other | Admitting: Nurse Practitioner

## 2017-09-18 VITALS — BP 100/57 | HR 67 | Ht 65.0 in | Wt 149.4 lb

## 2017-09-18 DIAGNOSIS — R569 Unspecified convulsions: Secondary | ICD-10-CM | POA: Diagnosis not present

## 2017-09-18 NOTE — Patient Instructions (Signed)
continue divalproex 500mg  twice a day - continue levetiracetam 1000mg  twice a day - continue oxcarbazepine 600mg  twice a day - check CBC, CMP annually (per PCP)

## 2017-12-30 ENCOUNTER — Telehealth (INDEPENDENT_AMBULATORY_CARE_PROVIDER_SITE_OTHER): Payer: Self-pay | Admitting: Physician Assistant

## 2017-12-30 NOTE — Telephone Encounter (Signed)
Open in error

## 2017-12-31 ENCOUNTER — Ambulatory Visit (INDEPENDENT_AMBULATORY_CARE_PROVIDER_SITE_OTHER): Payer: 59

## 2017-12-31 DIAGNOSIS — Z23 Encounter for immunization: Secondary | ICD-10-CM | POA: Diagnosis not present

## 2017-12-31 NOTE — Progress Notes (Signed)
Nurse administered flu vaccination.

## 2018-01-13 ENCOUNTER — Telehealth (INDEPENDENT_AMBULATORY_CARE_PROVIDER_SITE_OTHER): Payer: Self-pay | Admitting: Physician Assistant

## 2018-01-13 ENCOUNTER — Other Ambulatory Visit (INDEPENDENT_AMBULATORY_CARE_PROVIDER_SITE_OTHER): Payer: Self-pay | Admitting: Physician Assistant

## 2018-01-13 NOTE — Telephone Encounter (Signed)
Patient mother called to request a letter explaining Rebecca Gregory's disability. Patient mother states the she has been summons to do jury duty next month and in order for her to be exempt her PCP has to write a letter stating her disability and why she can not be summons.  Please Advice [phone] Rebecca Gregory)  Thank You Rebecca Gregory

## 2018-01-13 NOTE — Telephone Encounter (Signed)
Patients mother is aware and will pick up letter tomorrow 01/14/18. Nat Christen, CMA

## 2018-01-13 NOTE — Telephone Encounter (Signed)
FWD to PCP. Roda Lauture S Solaris Kram, CMA  

## 2018-01-13 NOTE — Telephone Encounter (Signed)
Letter written and signed. Please have them pick up.

## 2018-04-01 NOTE — Progress Notes (Signed)
[facility]  PATIENT: Rebecca Gregory DOB: Jun 08, 1970   REASON FOR VISIT: Follow-up for seizure disorder HISTORY FROM:DADand patient   HISTORY OF PRESENT ILLNESS:UPDATE 1/22/2020[name], 48 year old female returns for follow-up with history of seizure disorder.  She remains on Depakote Keppra and Trileptal without side effects.  She continues to have a few small seizures during her menstrual cycle.  No generalized seizures.  No falls no balance issues.  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  She does not drive.  She returns for reevaluation no recent labs.   UPDATE 7/10/2019[name], 48 year old female returns for follow-up with a history of seizure disorder.  She continues to have small seizures with her menstrual cycles.  No grand mal seizures.  She remains on Depakote Keppra and Trileptal without side effects.  No new neurologic complaints.  Reviewed recent CBC and CMP from primary care 05/02/2017 WNL.  She returns for reevaluation   UPDATE (03/20/17, VRP): Since last visit, doing well. Tolerating meds. No alleviating or aggravating factors. Small petit mal seizures continue prior to menstrual cycle. Some mild intermittent tremor.   PRIOR HPI (07/11/16): 48 year old female here for evaluation of seizure disorder. Patient has history of developmental delay. In 2002 she had first seizure of her life, with generalized convulsions, tonic-clonic, with incontinence. Patient went to the hospital for evaluation. She had a second seizure within 1 month and was started on medication. Parents think she was started on Dilantin at that time. Over the years a chin transition to other antiseizure medications and currently takes oxcarbazepine, Depakote, levetiracetam. Since that time she is doing well. She has had one grand mal seizure in March 2018. Patient has had 5 total grand mal seizures in her life. She does have 1-2 "mini seizures" per month where she has  staring, jittery sensation, abnormal mouth movements, makes a grunting noise or sound. These typically proceed her menstrual cycle.  Patient and her family used to live in [place] and recently moved to [place] in November 2017.    REVIEW OF SYSTEMS: Full 14 system review of systems performed and notable only for those listed, all others are neg:  Constitutional: neg  Cardiovascular: neg Ear/Nose/Throat: neg  Skin: neg Eyes: neg Respiratory: neg Gastroitestinal: neg  Hematology/Lymphatic: neg  Endocrine: neg Musculoskeletal:neg Allergy/Immunology: Environmental allergies Neurological: Seizure disorder Psychiatric: neg Sleep : neg   ALLERGIES: No Known Allergies  HOME MEDICATIONS: Outpatient Medications Prior to Visit  Medication Sig Dispense Refill  . acetaminophen (TYLENOL) 500 MG tablet Take 500 mg by mouth every 8 (eight) hours as needed for headache.    . carbamide peroxide (DEBROX) 6.5 % OTIC solution Place 5 drops into the right ear 2 (two) times daily. 15 mL 0  . divalproex (DEPAKOTE) 500 MG DR tablet Take 1 tablet (500 mg total) by mouth 2 (two) times daily. 180 tablet 4  . Fluticasone-Salmeterol (ADVAIR) 500-50 MCG/DOSE AEPB Inhale 1 puff into the lungs 2 (two) times daily.    Marland Kitchen ibuprofen (ADVIL,MOTRIN) 800 MG tablet Take 800 mg by mouth every 8 (eight) hours as needed.    . levETIRAcetam (KEPPRA) 1000 MG tablet Take 1 tablet (1,000 mg total) by mouth 2 (two) times daily. 180 tablet 4  . oxcarbazepine (TRILEPTAL) 600 MG tablet Take 1 tablet (600 mg total) by mouth 2 (two) times daily. 180 tablet 4   No facility-administered medications prior to visit.     PAST MEDICAL HISTORY: Past Medical History:  Diagnosis Date  .  Developmental delay   . Seizures ([place])    since 2002    PAST SURGICAL HISTORY: History reviewed. No pertinent surgical history.  FAMILY HISTORY: History reviewed. No pertinent family history.  SOCIAL HISTORY: Social History    Socioeconomic History  . Marital status: Single    Spouse name: Not on file  . Number of children: 0  . Years of education: 64  . Highest education level: Not on file  Occupational History    Comment: NA  Social Needs  . Financial resource strain: Not on file  . Food insecurity:    Worry: Not on file    Inability: Not on file  . Transportation needs:    Medical: Not on file    Non-medical: Not on file  Tobacco Use  . Smoking status: Never Smoker  . Smokeless tobacco: Never Used  Substance and Sexual Activity  . Alcohol use: No  . Drug use: No  . Sexual activity: Not Currently  Lifestyle  . Physical activity:    Days per week: Not on file    Minutes per session: Not on file  . Stress: Not on file  Relationships  . Social connections:    Talks on phone: Not on file    Gets together: Not on file    Attends religious service: Not on file    Active member of club or organization: Not on file    Attends meetings of clubs or organizations: Not on file    Relationship status: Not on file  . Intimate partner violence:    Fear of current or ex partner: Not on file    Emotionally abused: Not on file    Physically abused: Not on file    Forced sexual activity: Not on file  Other Topics Concern  . Not on file  Social History Narrative   Lives with parents   No caffeine     PHYSICAL EXAM  Vitals:   04/02/18 1350  BP: 114/61  Pulse: 74  Weight: 151 lb 9.6 oz (68.8 kg)  Height: 5\' 5"  (1.651 m)   Body mass index is 25.23 kg/m.  Generalized: Well developed, in no acute distress  Head: normocephalic and atraumatic,. Oropharynx benign  Neck: Supple,  Musculoskeletal: No deformity   Neurological examination   Mentation: Alert oriented to time, place, history taking. Attention span and concentration decreased.  Has limited speech.   Cranial nerve II-XII: Pupils were equal round reactive to light extraocular movements were full, visual field were full on  confrontational test. Facial sensation and strength were normal. hearing was intact to finger rubbing bilaterally. Uvula tongue midline. head turning and shoulder shrug were normal and symmetric.Tongue protrusion into cheek strength was normal. Motor: normal bulk and tone, full strength in the BUE, BLE,  Sensory: normal and symmetric to light touch,  Coordination: finger-nose-finger,  no dysmetria Reflexes: Brachioradialis 2/2, biceps 2/2, triceps 2/2, patellar 2/2, Achilles 2/2, plantar responses were flexor bilaterally. Gait and Station: Rising up from seated position without assistance, normal stance,  moderate stride, good arm swing, smooth turning, able to perform tiptoe, and heel walking without difficulty. Tandem gait is steady  DIAGNOSTIC DATA (LABS, IMAGING, TESTING) - I reviewed patient records, labs, notes, testing and imaging myself where available.  Lab Results  Component Value Date   WBC 5.0 05/02/2017   HGB 12.4 05/02/2017   HCT 36.8 05/02/2017   MCV 87 05/02/2017   PLT 243 05/02/2017      Component Value Date/Time  NA 140 05/02/2017 1646   K 4.4 05/02/2017 1646   CL 105 05/02/2017 1646   CO2 23 05/02/2017 1646   GLUCOSE 78 05/02/2017 1646   BUN 14 05/02/2017 1646   CREATININE 0.49 (L) 05/02/2017 1646   CALCIUM 9.1 05/02/2017 1646   PROT 7.2 05/02/2017 1646   ALBUMIN 4.5 05/02/2017 1646   AST 26 05/02/2017 1646   ALT 34 (H) 05/02/2017 1646   ALKPHOS 45 05/02/2017 1646   BILITOT <0.2 05/02/2017 1646   GFRNONAA 119 05/02/2017 1646   GFRAA 137 05/02/2017 1646     ASSESSMENT AND PLAN  48 y.o. year old female  has a past medical history of Developmental delay and Seizures ([place]). here to follow-up for seizure disorder.  She continues to have very mild seizures during her menstrual cycle.    PLAN:Continue divalproex 500mg  twice a day Continue levetiracetam 1000mg  twice a day Continue oxcarbazepine 600mg  twice a day CBC CMP  Today F/U in 1 year Dennie Bible, [facility], [facility], APRN  [facility] [address], [place], [place] [address] [phone]

## 2018-04-02 ENCOUNTER — Encounter: Payer: Self-pay | Admitting: Nurse Practitioner

## 2018-04-02 ENCOUNTER — Ambulatory Visit (INDEPENDENT_AMBULATORY_CARE_PROVIDER_SITE_OTHER): Payer: 59 | Admitting: Nurse Practitioner

## 2018-04-02 VITALS — BP 114/61 | HR 74 | Ht 65.0 in | Wt 151.6 lb

## 2018-04-02 DIAGNOSIS — Z760 Encounter for issue of repeat prescription: Secondary | ICD-10-CM | POA: Diagnosis not present

## 2018-04-02 DIAGNOSIS — Z5181 Encounter for therapeutic drug level monitoring: Secondary | ICD-10-CM

## 2018-04-02 DIAGNOSIS — R569 Unspecified convulsions: Secondary | ICD-10-CM | POA: Diagnosis not present

## 2018-04-02 MED ORDER — OXCARBAZEPINE 600 MG PO TABS: 600.0000 mg | ORAL_TABLET | Freq: Two times a day (BID) | ORAL | 3 refills | Status: DC

## 2018-04-02 MED ORDER — DIVALPROEX SODIUM 500 MG PO DR TAB: 500.0000 mg | DELAYED_RELEASE_TABLET | Freq: Two times a day (BID) | ORAL | 3 refills | Status: DC

## 2018-04-02 MED ORDER — LEVETIRACETAM 1000 MG PO TABS: 1000.0000 mg | ORAL_TABLET | Freq: Two times a day (BID) | ORAL | 3 refills | Status: DC

## 2018-04-02 NOTE — Progress Notes (Signed)
I reviewed note and agree with plan.   Penni Bombard, MD 10/22/7515, 0:01 PM Certified in Neurology, Neurophysiology and Neuroimaging  [facility] [address], [place], [place] [address] [phone]

## 2018-04-02 NOTE — Patient Instructions (Signed)
Continue divalproex 500mg  twice a day Continue levetiracetam 1000mg  twice a day Continue oxcarbazepine 600mg  twice a day CBC CMP  today

## 2018-04-03 ENCOUNTER — Telehealth: Payer: Self-pay | Admitting: *Deleted

## 2018-04-03 LAB — COMPREHENSIVE METABOLIC PANEL
ALBUMIN: 4.1 g/dL (ref 3.8–4.8)
ALK PHOS: 42 IU/L (ref 39–117)
ALT: 9 IU/L (ref 0–32)
AST: 12 IU/L (ref 0–40)
Albumin/Globulin Ratio: 1.9 (ref 1.2–2.2)
BUN / CREAT RATIO: 19 (ref 9–23)
BUN: 12 mg/dL (ref 6–24)
CHLORIDE: 102 mmol/L (ref 96–106)
CO2: 23 mmol/L (ref 20–29)
Calcium: 9.3 mg/dL (ref 8.7–10.2)
Creatinine, Ser: 0.62 mg/dL (ref 0.57–1.00)
GFR calc Af Amer: 126 mL/min/{1.73_m2} (ref >59)
GFR calc non Af Amer: 109 mL/min/{1.73_m2} (ref >59)
GLUCOSE: 95 mg/dL (ref 65–99)
Globulin, Total: 2.2 g/dL (ref 1.5–4.5)
Potassium: 4.2 mmol/L (ref 3.5–5.2)
Sodium: 144 mmol/L (ref 134–144)
Total Protein: 6.3 g/dL (ref 6.0–8.5)

## 2018-04-03 LAB — CBC WITH DIFFERENTIAL/PLATELET
BASOS ABS: 0 10*3/uL (ref 0.0–0.2)
Basos: 1 %
EOS (ABSOLUTE): 0.1 10*3/uL (ref 0.0–0.4)
Eos: 2 %
Hematocrit: 35.4 % (ref 34.0–46.6)
Hemoglobin: 11.9 g/dL (ref 11.1–15.9)
IMMATURE GRANULOCYTES: 0 %
Immature Grans (Abs): 0 10*3/uL (ref 0.0–0.1)
Lymphocytes Absolute: 1.3 10*3/uL (ref 0.7–3.1)
Lymphs: 28 %
MCH: 28.7 pg (ref 26.6–33.0)
MCHC: 33.6 g/dL (ref 31.5–35.7)
MCV: 85 fL (ref 79–97)
Monocytes Absolute: 0.7 10*3/uL (ref 0.1–0.9)
Monocytes: 15 %
Neutrophils Absolute: 2.5 10*3/uL (ref 1.4–7.0)
Neutrophils: 54 %
Platelets: 301 10*3/uL (ref 150–450)
RBC: 4.15 x10E6/uL (ref 3.77–5.28)
RDW: 12.9 % (ref 11.7–15.4)
WBC: 4.7 10*3/uL (ref 3.4–10.8)

## 2018-04-03 NOTE — Telephone Encounter (Signed)
Called father, Rebecca Gregory on [place] and informed him patient's labs are good, and a copy was faxed to her PCP for review. He  verbalized understanding, appreciation.

## 2018-05-26 ENCOUNTER — Other Ambulatory Visit: Payer: Self-pay | Admitting: Diagnostic Neuroimaging

## 2018-05-26 DIAGNOSIS — Z760 Encounter for issue of repeat prescription: Secondary | ICD-10-CM

## 2018-05-26 DIAGNOSIS — R569 Unspecified convulsions: Secondary | ICD-10-CM

## 2018-05-27 ENCOUNTER — Telehealth: Payer: Self-pay | Admitting: Nurse Practitioner

## 2018-05-27 DIAGNOSIS — R569 Unspecified convulsions: Secondary | ICD-10-CM

## 2018-05-27 DIAGNOSIS — Z760 Encounter for issue of repeat prescription: Secondary | ICD-10-CM

## 2018-05-27 MED ORDER — LEVETIRACETAM 1000 MG PO TABS: 1000.0000 mg | ORAL_TABLET | Freq: Two times a day (BID) | ORAL | 3 refills | Status: DC

## 2018-05-27 MED ORDER — DIVALPROEX SODIUM 500 MG PO DR TAB: 500.0000 mg | DELAYED_RELEASE_TABLET | Freq: Two times a day (BID) | ORAL | 3 refills | Status: DC

## 2018-05-27 MED ORDER — OXCARBAZEPINE 600 MG PO TABS: 600.0000 mg | ORAL_TABLET | Freq: Two times a day (BID) | ORAL | 3 refills | Status: DC

## 2018-05-27 NOTE — Telephone Encounter (Signed)
Scott,Renee(mother on DPR) has called to inform that pt only has a supply of 1 week remaining of her levETIRAcetam (KEPPRA) 1000 MG tabletmother is asking why it is denied.  Please call

## 2018-05-27 NOTE — Addendum Note (Signed)
Addended by: Brandon Melnick on: 05/27/2018 11:57 AM   Modules accepted: Orders

## 2018-05-27 NOTE — Telephone Encounter (Signed)
Spoke to mother of pt.  Relayed that [facility].  She is not using this now.  Needs to go to [facility].  [facility] at [facility] per request.

## 2018-08-18 ENCOUNTER — Other Ambulatory Visit: Payer: Self-pay | Admitting: Physician Assistant

## 2018-08-18 DIAGNOSIS — Z1231 Encounter for screening mammogram for malignant neoplasm of breast: Secondary | ICD-10-CM

## 2018-10-01 ENCOUNTER — Ambulatory Visit
Admission: RE | Admit: 2018-10-01 | Discharge: 2018-10-01 | Disposition: A | Discharge status: Home / Self Care | Payer: 59 | Source: Ambulatory Visit | Attending: *Deleted | Admitting: *Deleted

## 2018-10-01 ENCOUNTER — Other Ambulatory Visit: Payer: Self-pay

## 2018-10-01 DIAGNOSIS — Z1231 Encounter for screening mammogram for malignant neoplasm of breast: Secondary | ICD-10-CM

## 2018-12-17 ENCOUNTER — Encounter (INDEPENDENT_AMBULATORY_CARE_PROVIDER_SITE_OTHER): Payer: Self-pay | Admitting: Primary Care

## 2018-12-17 ENCOUNTER — Other Ambulatory Visit: Payer: Self-pay

## 2018-12-17 ENCOUNTER — Ambulatory Visit (INDEPENDENT_AMBULATORY_CARE_PROVIDER_SITE_OTHER): Payer: 59 | Admitting: Primary Care

## 2018-12-17 VITALS — BP 116/71 | HR 81 | Temp 99.1°F | Ht 65.0 in | Wt 143.2 lb

## 2018-12-17 DIAGNOSIS — Z23 Encounter for immunization: Secondary | ICD-10-CM

## 2018-12-17 DIAGNOSIS — N946 Dysmenorrhea, unspecified: Secondary | ICD-10-CM

## 2018-12-17 DIAGNOSIS — R569 Unspecified convulsions: Secondary | ICD-10-CM | POA: Diagnosis not present

## 2018-12-17 DIAGNOSIS — Z0000 Encounter for general adult medical examination without abnormal findings: Secondary | ICD-10-CM

## 2018-12-17 NOTE — Progress Notes (Signed)
Established Patient Office Visit  Subjective:  Patient ID: Rebecca Gregory, female    DOB: [birth year]  Age: 48 y.o. MRN: [id number]  CC:  Chief Complaint  Patient presents with  . Establish Care  . Medication Refill    HPI Rebecca Gregory presents for annual visit. Menstrual cycle is regular with some abdominal cramping. She is follow neurology for seizures and has medications refills available. She voices no problems or concern. 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 Establishing care with new provider but established patient with [facility].   Past Medical History:  Diagnosis Date  . Developmental delay   . Seizures ([place])    since 2002    No past surgical history on file.  No family history on file.  Social History   Socioeconomic History  . Marital status: Single    Spouse name: Not on file  . Number of children: 0  . Years of education: 29  . Highest education level: Not on file  Occupational History    Comment: NA  Social Needs  . Financial resource strain: Not on file  . Food insecurity    Worry: Not on file    Inability: Not on file  . Transportation needs    Medical: Not on file    Non-medical: Not on file  Tobacco Use  . Smoking status: Never Smoker  . Smokeless tobacco: Never Used  Substance and Sexual Activity  . Alcohol use: No  . Drug use: No  . Sexual activity: Not Currently  Lifestyle  . Physical activity    Days per week: Not on file    Minutes per session: Not on file  . Stress: Not on file  Relationships  . Social Herbalist on phone: Not on file    Gets together: Not on file    Attends religious service: Not on file    Active member of club or organization: Not on file    Attends meetings of clubs or organizations: Not on file    Relationship status: Not on file  . Intimate partner violence    Fear of current or ex partner: Not on file    Emotionally abused: Not on file    Physically abused:  Not on file    Forced sexual activity: Not on file  Other Topics Concern  . Not on file  Social History Narrative   Lives with parents   No caffeine    Outpatient Medications Prior to Visit  Medication Sig Dispense Refill  . divalproex (DEPAKOTE) 500 MG DR tablet Take 1 tablet (500 mg total) by mouth 2 (two) times daily. 180 tablet 3  . levETIRAcetam (KEPPRA) 1000 MG tablet Take 1 tablet (1,000 mg total) by mouth 2 (two) times daily. 180 tablet 3  . oxcarbazepine (TRILEPTAL) 600 MG tablet Take 1 tablet (600 mg total) by mouth 2 (two) times daily. 180 tablet 3  . Fluticasone-Salmeterol (ADVAIR) 500-50 MCG/DOSE AEPB Inhale 1 puff into the lungs 2 (two) times daily.    Marland Kitchen acetaminophen (TYLENOL) 500 MG tablet Take 500 mg by mouth every 8 (eight) hours as needed for headache.    . carbamide peroxide (DEBROX) 6.5 % OTIC solution Place 5 drops into the right ear 2 (two) times daily. 15 mL 0  . ibuprofen (ADVIL,MOTRIN) 800 MG tablet Take 800 mg by mouth every 8 (eight) hours as needed.     No facility-administered medications prior to visit.  No Known Allergies  ROS Review of Systems  All other systems reviewed and are negative.     Objective:    Physical Exam  Constitutional: She appears well-developed and well-nourished.  HENT:  Head: Normocephalic.  Eyes: Pupils are equal, round, and reactive to light. EOM are normal.  Neck: Normal range of motion.  Cardiovascular: Normal rate and regular rhythm.  Pulmonary/Chest: Effort normal and breath sounds normal.  Abdominal: Soft. Bowel sounds are normal.  Musculoskeletal: Normal range of motion.  Neurological: She is alert.  Skin: Skin is warm.  Psychiatric: She has a normal mood and affect.    BP 116/71 (BP Location: Right Arm, Patient Position: Sitting, Cuff Size: Normal)   Pulse 81   Temp 99.1 F (37.3 C) (Temporal)   Ht '5\' 5"'  (1.651 m)   Wt 143 lb 3.2 oz (65 kg)   LMP 12/11/2018 (Approximate)   SpO2 100%   BMI 23.83  kg/m  Wt Readings from Last 3 Encounters:  12/17/18 143 lb 3.2 oz (65 kg)  04/02/18 151 lb 9.6 oz (68.8 kg)  09/18/17 149 lb 6.4 oz (67.8 kg)     Health Maintenance Due  Topic Date Due  . INFLUENZA VACCINE  10/11/2018    There are no preventive care reminders to display for this patient.  No results found for: TSH Lab Results  Component Value Date   WBC 4.7 04/02/2018   HGB 11.9 04/02/2018   HCT 35.4 04/02/2018   MCV 85 04/02/2018   PLT 301 04/02/2018   Lab Results  Component Value Date   NA 144 04/02/2018   K 4.2 04/02/2018   CO2 23 04/02/2018   GLUCOSE 95 04/02/2018   BUN 12 04/02/2018   CREATININE 0.62 04/02/2018   BILITOT <0.2 04/02/2018   ALKPHOS 42 04/02/2018   AST 12 04/02/2018   ALT 9 04/02/2018   PROT 6.3 04/02/2018   ALBUMIN 4.1 04/02/2018   CALCIUM 9.3 04/02/2018   No results found for: CHOL No results found for: HDL No results found for: LDLCALC No results found for: TRIG No results found for: CHOLHDL No results found for: HGBA1C    Assessment & Plan:  Darrien was seen today for establish care and medication refill.  Diagnoses and all orders for this visit:  Annual physical exam -     CBC with Differential -     CMP14+EGFR  Need for immunization against influenza -     Flu Vaccine QUAD 36+ mos IM  Seizures ([place]) Managed by neurology  Unable to answer if or when she had her last seizure.    Follow-up: Return if symptoms worsen or fail to improve.    Kerin Perna, NP

## 2018-12-17 NOTE — Patient Instructions (Signed)
  Rebecca Gregory , Thank you for taking time to come for your Medicare Wellness Visit. I appreciate your ongoing commitment to your health goals. Please review the following plan we discussed and let me know if I can assist you in the future.   These are the goals we discussed: Goals   None     This is a list of the screening recommended for you and due dates:  Health Maintenance  Topic Date Due  . Flu Shot  10/11/2018  . Pap Smear  06/21/2019  . Tetanus Vaccine  06/21/2026  . HIV Screening  Completed

## 2018-12-18 LAB — CMP14+EGFR
ALT: 10 IU/L (ref 0–32)
AST: 11 IU/L (ref 0–40)
Albumin/Globulin Ratio: 2.1 (ref 1.2–2.2)
Albumin: 4.6 g/dL (ref 3.8–4.8)
Alkaline Phosphatase: 38 IU/L — ABNORMAL LOW (ref 39–117)
BUN/Creatinine Ratio: 23 (ref 9–23)
BUN: 14 mg/dL (ref 6–24)
Bilirubin Total: <0.2 mg/dL (ref 0.0–1.2)
CO2: 25 mmol/L (ref 20–29)
Calcium: 9.4 mg/dL (ref 8.7–10.2)
Chloride: 106 mmol/L (ref 96–106)
Creatinine, Ser: 0.6 mg/dL (ref 0.57–1.00)
GFR calc Af Amer: 126 mL/min/{1.73_m2} (ref >59)
GFR calc non Af Amer: 110 mL/min/{1.73_m2} (ref >59)
Globulin, Total: 2.2 g/dL (ref 1.5–4.5)
Glucose: 86 mg/dL (ref 65–99)
Potassium: 4.8 mmol/L (ref 3.5–5.2)
Sodium: 140 mmol/L (ref 134–144)
Total Protein: 6.8 g/dL (ref 6.0–8.5)

## 2018-12-18 LAB — CBC WITH DIFFERENTIAL/PLATELET
Basophils Absolute: 0 10*3/uL (ref 0.0–0.2)
Basos: 1 %
EOS (ABSOLUTE): 0.1 10*3/uL (ref 0.0–0.4)
Eos: 3 %
Hematocrit: 32.3 % — ABNORMAL LOW (ref 34.0–46.6)
Hemoglobin: 11 g/dL — ABNORMAL LOW (ref 11.1–15.9)
Immature Grans (Abs): 0 10*3/uL (ref 0.0–0.1)
Immature Granulocytes: 1 %
Lymphocytes Absolute: 1.3 10*3/uL (ref 0.7–3.1)
Lymphs: 32 %
MCH: 28.7 pg (ref 26.6–33.0)
MCHC: 34.1 g/dL (ref 31.5–35.7)
MCV: 84 fL (ref 79–97)
Monocytes Absolute: 0.5 10*3/uL (ref 0.1–0.9)
Monocytes: 13 %
Neutrophils Absolute: 2.1 10*3/uL (ref 1.4–7.0)
Neutrophils: 50 %
Platelets: 336 10*3/uL (ref 150–450)
RBC: 3.83 x10E6/uL (ref 3.77–5.28)
RDW: 12.7 % (ref 11.7–15.4)
WBC: 4.1 10*3/uL (ref 3.4–10.8)

## 2019-02-16 ENCOUNTER — Telehealth: Payer: Self-pay | Admitting: Diagnostic Neuroimaging

## 2019-02-16 ENCOUNTER — Other Ambulatory Visit: Payer: Self-pay

## 2019-02-16 DIAGNOSIS — R569 Unspecified convulsions: Secondary | ICD-10-CM

## 2019-02-16 DIAGNOSIS — Z760 Encounter for issue of repeat prescription: Secondary | ICD-10-CM

## 2019-02-16 MED ORDER — LEVETIRACETAM 1000 MG PO TABS: 1000.0000 mg | ORAL_TABLET | Freq: Two times a day (BID) | ORAL | 0 refills | Status: DC

## 2019-02-16 NOTE — Telephone Encounter (Signed)
Pt's mother Rebecca Gregory on [place] called stating that the pt is needing a refill on her levETIRAcetam (KEPPRA) 1000 MG tablet and that the pharmacy has reached out for this refill. Mother also states that her other medications do not have any more refills on them. Please advise.

## 2019-02-16 NOTE — Telephone Encounter (Signed)
I called pts mom Rebecca Gregory that pt was given her depakote and trileptal on 02/12/2019. Pt will not need another refill till 05/2019. I stated she has an appt with Amy NP in January and a refill can be given at that time. I stated pharmacy confirmed pt lost her rx keppra and use up all her refills.I stated a 3 month refill was given till her appt in January 2021.The mom verbalized understanding.

## 2019-02-16 NOTE — Telephone Encounter (Signed)
I called pts listed pharmacy and spoke with the pharmacist. I stated pt refills should not expired tilll 05/2019. They stated pt pick up depakote and trileptal, on 02/12/2019. They also stated pt misplace and loss her keppra and use the last refill. They stated pt only needs refill on keppra.

## 2019-04-08 ENCOUNTER — Ambulatory Visit (INDEPENDENT_AMBULATORY_CARE_PROVIDER_SITE_OTHER): Payer: 59 | Admitting: Family Medicine

## 2019-04-08 ENCOUNTER — Other Ambulatory Visit: Payer: Self-pay

## 2019-04-08 ENCOUNTER — Encounter: Payer: Self-pay | Admitting: Family Medicine

## 2019-04-08 VITALS — BP 118/68 | HR 80 | Temp 97.5°F | Ht 65.0 in | Wt 149.0 lb

## 2019-04-08 DIAGNOSIS — R569 Unspecified convulsions: Secondary | ICD-10-CM

## 2019-04-08 DIAGNOSIS — Z5181 Encounter for therapeutic drug level monitoring: Secondary | ICD-10-CM | POA: Diagnosis not present

## 2019-04-08 DIAGNOSIS — Z760 Encounter for issue of repeat prescription: Secondary | ICD-10-CM

## 2019-04-08 MED ORDER — OXCARBAZEPINE 600 MG PO TABS: 600.0000 mg | ORAL_TABLET | Freq: Two times a day (BID) | ORAL | 3 refills | Status: DC

## 2019-04-08 MED ORDER — DIVALPROEX SODIUM 500 MG PO DR TAB: 500.0000 mg | DELAYED_RELEASE_TABLET | Freq: Two times a day (BID) | ORAL | 3 refills | Status: DC

## 2019-04-08 MED ORDER — LEVETIRACETAM 1000 MG PO TABS: 1000.0000 mg | ORAL_TABLET | Freq: Two times a day (BID) | ORAL | 3 refills | Status: DC

## 2019-04-08 NOTE — Progress Notes (Signed)
PATIENT: Rebecca Gregory DOB: 17-May-1970  REASON FOR VISIT: follow up HISTORY FROM: patient  Chief Complaint  Patient presents with  . Follow-up    RM8. With mother. Pts mother states she is doing about the same. She still has Mini Mal Seizures before her cycle.     HISTORY OF PRESENT ILLNESS: Today 04/08/19 Rebecca Gregory is a 49 y.o. female here today for follow up for seizure disorder. She continues divalproex 500mg  BID, levetiracetam 1000mg  BID and oxcarbamazepine 600mg  twice daily. She is tolerating medications well. She has a very mild tremor of both hands that has remained stable. Mom reports she may have one small seizure each month with onset of menstrual cycle. She is not on birth control. She had a tubal ligation years ago. She has a great appetite. She stays well hydrated. She is working part time for a senior living facility. She lives with mom, Joseph Art, and dad, Jemes. She is able to dress and bathe herself. She does not drive. She can prepare meals in microwave but does not cook. She is feeling well today and without concerns.    HISTORY: (copied from [employer] note on 04/02/2018)  UPDATE 1/22/2020CM Ms. Wilmott, 49 year old female returns for follow-up with history of seizure disorder.  She remains on Depakote Keppra and Trileptal without side effects.  She continues to have a few small seizures during her menstrual cycle.  No generalized seizures.  No falls no balance issues.  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  She does not drive.  She returns for reevaluation no recent labs.   UPDATE 7/10/2019CM Ms. Hinderliter, 49 year old female returns for follow-up with a history of seizure disorder.  She continues to have small seizures with her menstrual cycles.  No grand mal seizures.  She remains on Depakote Keppra and Trileptal without side effects.  No new neurologic complaints.  Reviewed recent CBC and CMP from primary care 05/02/2017 WNL.  She returns for  reevaluation  UPDATE (03/20/17, VRP): Since last visit, doingwell. Toleratingmeds. No alleviating or aggravating factors.Small petit mal seizures continue prior to menstrual cycle. Some mild intermittent tremor.   PRIOR HPI (07/11/16):49 year old female here for evaluation of seizure disorder. Patient has history of developmental delay. In 2002 she had first seizure of her life, with generalized convulsions, tonic-clonic, with incontinence. Patient went to the hospital for evaluation. She had a second seizure within 1 month and was started on medication. Parents think she was started on Dilantin at that time. Over the years a chin transition to other antiseizure medications and currently takes oxcarbazepine, Depakote, levetiracetam. Since that time she is doing well. She has had one grand mal seizure in March 2018. Patient has had 5 total grand mal seizures in her life. She does have 1-2 "mini seizures" per month where she has staring, jittery sensation, abnormal mouth movements, makes a grunting noise or sound. These typically proceed her menstrual cycle.  Patient and her family used to live in [place] and recently moved to [place] in November 2017.   REVIEW OF SYSTEMS: Out of a complete 14 system review of symptoms, the patient complains only of the following symptoms, seizure, tremor and all other reviewed systems are negative.  ALLERGIES: No Known Allergies  HOME MEDICATIONS: Outpatient Medications Prior to Visit  Medication Sig Dispense Refill  . divalproex (DEPAKOTE) 500 MG DR tablet Take 1 tablet (500 mg total) by mouth 2 (two) times daily. 180 tablet 3  . Fluticasone-Salmeterol (ADVAIR) 500-50 MCG/DOSE AEPB Inhale  1 puff into the lungs 2 (two) times daily.    Marland Kitchen levETIRAcetam (KEPPRA) 1000 MG tablet Take 1 tablet (1,000 mg total) by mouth 2 (two) times daily. 180 tablet 0  . oxcarbazepine (TRILEPTAL) 600 MG tablet Take 1 tablet (600 mg total) by mouth 2 (two) times daily. 180  tablet 3   No facility-administered medications prior to visit.    PAST MEDICAL HISTORY: Past Medical History:  Diagnosis Date  . Developmental delay   . Seizures ([place])    since 2002    PAST SURGICAL HISTORY: No past surgical history on file.  FAMILY HISTORY: No family history on file.  SOCIAL HISTORY: Social History   Socioeconomic History  . Marital status: Single    Spouse name: Not on file  . Number of children: 0  . Years of education: 32  . Highest education level: Not on file  Occupational History    Comment: NA  Tobacco Use  . Smoking status: Never Smoker  . Smokeless tobacco: Never Used  Substance and Sexual Activity  . Alcohol use: No  . Drug use: No  . Sexual activity: Not Currently  Other Topics Concern  . Not on file  Social History Narrative   Lives with parents   No caffeine   Social Determinants of Health   Financial Resource Strain:   . Difficulty of Paying Living Expenses: Not on file  Food Insecurity:   . Worried About Charity fundraiser in the Last Year: Not on file  . Ran Out of Food in the Last Year: Not on file  Transportation Needs:   . Lack of Transportation (Medical): Not on file  . Lack of Transportation (Non-Medical): Not on file  Physical Activity:   . Days of Exercise per Week: Not on file  . Minutes of Exercise per Session: Not on file  Stress:   . Feeling of Stress : Not on file  Social Connections:   . Frequency of Communication with Friends and Family: Not on file  . Frequency of Social Gatherings with Friends and Family: Not on file  . Attends Religious Services: Not on file  . Active Member of Clubs or Organizations: Not on file  . Attends Archivist Meetings: Not on file  . Marital Status: Not on file  Intimate Partner Violence:   . Fear of Current or Ex-Partner: Not on file  . Emotionally Abused: Not on file  . Physically Abused: Not on file  . Sexually Abused: Not on file      PHYSICAL  EXAM  Vitals:   04/08/19 1325  BP: 118/68  Pulse: 80  Temp: (!) 97.5 F (36.4 C)  Weight: 149 lb (67.6 kg)  Height: 5\' 5"  (1.651 m)   Body mass index is 24.79 kg/m.  Generalized: Well developed, in no acute distress  Cardiology: normal rate and rhythm, no murmur noted Respiratory: clear to auscultation bilaterally  Neurological examination  Mentation: Alert oriented to time, place, history taking. Follows all commands speech and language fluent Cranial nerve II-XII: Pupils were equal round reactive to light. Extraocular movements were full, visual field were full on confrontational test. Facial sensation and strength were normal. Uvula tongue midline. Head turning and shoulder shrug  were normal and symmetric. Motor: The motor testing reveals 5 over 5 strength of all 4 extremities. Good symmetric motor tone is noted throughout. Mild tremor of left thumb  Sensory: Sensory testing is intact to soft touch on all 4 extremities. No evidence of  extinction is noted.  Coordination: Cerebellar testing reveals good finger-nose-finger and heel-to-shin bilaterally.  Gait and station: Gait is normal. eep tendon reflexes are symmetric and normal bilaterally.   DIAGNOSTIC DATA (LABS, IMAGING, TESTING) - I reviewed patient records, labs, notes, testing and imaging myself where available.  No flowsheet data found.   Lab Results  Component Value Date   WBC 4.1 12/17/2018   HGB 11.0 (L) 12/17/2018   HCT 32.3 (L) 12/17/2018   MCV 84 12/17/2018   PLT 336 12/17/2018      Component Value Date/Time   NA 140 12/17/2018 1430   K 4.8 12/17/2018 1430   CL 106 12/17/2018 1430   CO2 25 12/17/2018 1430   GLUCOSE 86 12/17/2018 1430   BUN 14 12/17/2018 1430   CREATININE 0.60 12/17/2018 1430   CALCIUM 9.4 12/17/2018 1430   PROT 6.8 12/17/2018 1430   ALBUMIN 4.6 12/17/2018 1430   AST 11 12/17/2018 1430   ALT 10 12/17/2018 1430   ALKPHOS 38 (L) 12/17/2018 1430   BILITOT <0.2 12/17/2018 1430    GFRNONAA 110 12/17/2018 1430   GFRAA 126 12/17/2018 1430   No results found for: CHOL, HDL, LDLCALC, LDLDIRECT, TRIG, CHOLHDL No results found for: HGBA1C No results found for: VITAMINB12 No results found for: TSH     ASSESSMENT AND PLAN 49 y.o. year old female  has a past medical history of Developmental delay and Seizures ([place]). here with     ICD-10-CM   1. Seizures ([place])  R56.9 CMP    Valproic Acid Level    Carbamazepine level, total  2. Therapeutic drug monitoring  Z51.81 CMP    Valproic Acid Level    Carbamazepine level, total    Azriella is doing very well. We will continue divalproex 500mg  BID, levetiracetam 1000mg  BID and oxcarbamazepine 600mg  twice daily. I will update labs today. Refills have been sent to pharmacy. We will continue to monitor closely. Adequate hydration and seizure precautions discussed. She will follow up in 1 year, sooner if needed. She and mom verbalize understanding and agreement with this plan.    No orders of the defined types were placed in this encounter.    No orders of the defined types were placed in this encounter.     Debbora Presto, FNP-C 04/08/2019, 1:38 PM [facility] [address], [place], [place] [address] [phone]

## 2019-04-08 NOTE — Patient Instructions (Signed)
We will continue current treatment plan. I will update labs today and have refilled medications. Stay well hydrated and active.   Follow up in 1 year, sooner if needed   Seizure, Adult A seizure is a sudden burst of abnormal electrical activity in the brain. Seizures usually last from 30 seconds to 2 minutes. They can cause many different symptoms. Usually, seizures are not harmful unless they last a long time. What are the causes? Common causes of this condition include:  Fever or infection.  Conditions that affect the brain, such as: ? A brain abnormality that you were born with. ? A brain or head injury. ? Bleeding in the brain. ? A tumor. ? Stroke. ? Brain disorders such as autism or cerebral palsy.  Low blood sugar.  Conditions that are passed from parent to child (are inherited).  Problems with substances, such as: ? Having a reaction to a drug or a medicine. ? Suddenly stopping the use of a substance (withdrawal). In some cases, the cause may not be known. A person who has repeated seizures over time without a clear cause has a condition called epilepsy. What increases the risk? You are more likely to get this condition if you have:  A family history of epilepsy.  Had a seizure in the past.  A brain disorder.  A history of head injury, lack of oxygen at birth, or strokes. What are the signs or symptoms? There are many types of seizures. The symptoms vary depending on the type of seizure you have. Examples of symptoms during a seizure include:  Shaking (convulsions).  Stiffness in the body.  Passing out (losing consciousness).  Head nodding.  Staring.  Not responding to sound or touch.  Loss of bladder control and bowel control. Some people have symptoms right before and right after a seizure happens. Symptoms before a seizure may include:  Fear.  Worry (anxiety).  Feeling like you may vomit (nauseous).  Feeling like the room is spinning (vertigo).   Feeling like you saw or heard something before (dj vu).  Odd tastes or smells.  Changes in how you see. You may see flashing lights or spots. Symptoms after a seizure happens can include:  Confusion.  Sleepiness.  Headache.  Weakness on one side of the body. How is this treated? Most seizures will stop on their own in under 5 minutes. In these cases, no treatment is needed. Seizures that last longer than 5 minutes will usually need treatment. Treatment can include:  Medicines given through an IV tube.  Avoiding things that are known to cause your seizures. These can include medicines that you take for another condition.  Medicines to treat epilepsy.  Surgery to stop the seizures. This may be needed if medicines do not help. Follow these instructions at home: Medicines  Take over-the-counter and prescription medicines only as told by your doctor.  Do not eat or drink anything that may keep your medicine from working, such as alcohol. Activity  Do not do any activities that would be dangerous if you had another seizure, like driving or swimming. Wait until your doctor says it is safe for you to do them.  If you live in the [place] ask your local DMV (department of motor vehicles) when you can drive.  Get plenty of rest. Teaching others Teach friends and family what to do when you have a seizure. They should:  Lay you on the ground.  Protect your head and body.  Loosen any tight clothing  around your neck.  Turn you on your side.  Not hold you down.  Not put anything into your mouth.  Know whether or not you need emergency care.  Stay with you until you are better.  General instructions  Contact your doctor each time you have a seizure.  Avoid anything that gives you seizures.  Keep a seizure diary. Write down: ? What you think caused each seizure. ? What you remember about each seizure.  Keep all follow-up visits as told by your doctor. This is  important. Contact a doctor if:  You have another seizure.  You have seizures more often.  There is any change in what happens during your seizures.  You keep having seizures with treatment.  You have symptoms of being sick or having an infection. Get help right away if:  You have a seizure that: ? Lasts longer than 5 minutes. ? Is different than seizures you had before. ? Makes it harder to breathe. ? Happens after you hurt your head.  You have any of these symptoms after a seizure: ? Not being able to speak. ? Not being able to use a part of your body. ? Confusion. ? A bad headache.  You have two or more seizures in a row.  You do not wake up right after a seizure.  You get hurt during a seizure. These symptoms may be an emergency. Do not wait to see if the symptoms will go away. Get medical help right away. Call your local emergency services (911 in the [place] Do not drive yourself to the hospital. Summary  Seizures usually last from 30 seconds to 2 minutes. Usually, they are not harmful unless they last a long time.  Do not eat or drink anything that may keep your medicine from working, such as alcohol.  Teach friends and family what to do when you have a seizure.  Contact your doctor each time you have a seizure. This information is not intended to replace advice given to you by your health care provider. Make sure you discuss any questions you have with your health care provider. Document Revised: 05/16/2018 Document Reviewed: 05/16/2018 Elsevier Patient Education  [place].

## 2019-04-09 ENCOUNTER — Telehealth: Payer: Self-pay | Admitting: *Deleted

## 2019-04-09 LAB — CBC WITH DIFFERENTIAL/PLATELET
Basophils Absolute: 0 10*3/uL (ref 0.0–0.2)
Basos: 1 %
EOS (ABSOLUTE): 0.2 10*3/uL (ref 0.0–0.4)
Eos: 3 %
Hematocrit: 35.2 % (ref 34.0–46.6)
Hemoglobin: 11.9 g/dL (ref 11.1–15.9)
Immature Grans (Abs): 0 10*3/uL (ref 0.0–0.1)
Immature Granulocytes: 0 %
Lymphocytes Absolute: 1.1 10*3/uL (ref 0.7–3.1)
Lymphs: 23 %
MCH: 27.7 pg (ref 26.6–33.0)
MCHC: 33.8 g/dL (ref 31.5–35.7)
MCV: 82 fL (ref 79–97)
Monocytes Absolute: 0.7 10*3/uL (ref 0.1–0.9)
Monocytes: 14 %
Neutrophils Absolute: 2.8 10*3/uL (ref 1.4–7.0)
Neutrophils: 59 %
Platelets: 254 10*3/uL (ref 150–450)
RBC: 4.29 x10E6/uL (ref 3.77–5.28)
RDW: 14.1 % (ref 11.7–15.4)
WBC: 4.8 10*3/uL (ref 3.4–10.8)

## 2019-04-09 LAB — COMPREHENSIVE METABOLIC PANEL
ALT: 9 IU/L (ref 0–32)
AST: 13 IU/L (ref 0–40)
Albumin/Globulin Ratio: 2 (ref 1.2–2.2)
Albumin: 4.3 g/dL (ref 3.8–4.8)
Alkaline Phosphatase: 43 IU/L (ref 39–117)
BUN/Creatinine Ratio: 16 (ref 9–23)
BUN: 18 mg/dL (ref 6–24)
Bilirubin Total: <0.2 mg/dL (ref 0.0–1.2)
CO2: 23 mmol/L (ref 20–29)
Calcium: 9.4 mg/dL (ref 8.7–10.2)
Chloride: 107 mmol/L — ABNORMAL HIGH (ref 96–106)
Creatinine, Ser: 1.15 mg/dL — ABNORMAL HIGH (ref 0.57–1.00)
GFR calc Af Amer: 66 mL/min/{1.73_m2} (ref >59)
GFR calc non Af Amer: 57 mL/min/{1.73_m2} — ABNORMAL LOW (ref >59)
Globulin, Total: 2.2 g/dL (ref 1.5–4.5)
Glucose: 92 mg/dL (ref 65–99)
Potassium: 4.6 mmol/L (ref 3.5–5.2)
Sodium: 141 mmol/L (ref 134–144)
Total Protein: 6.5 g/dL (ref 6.0–8.5)

## 2019-04-09 LAB — CARBAMAZEPINE LEVEL, TOTAL: Carbamazepine (Tegretol), S: <2 ug/mL — ABNORMAL LOW (ref 4.0–12.0)

## 2019-04-09 LAB — VALPROIC ACID LEVEL: Valproic Acid Lvl: 44 ug/mL — ABNORMAL LOW (ref 50–100)

## 2019-04-09 NOTE — Telephone Encounter (Signed)
-----   Message from Debbora Presto, NP sent at 04/09/2019  4:32 PM EST ----- Please let her mother know that overall her labs look okay.  Her Depakote and Tegretol levels were a little low, however, this could have been due to the timing of labs in connection with dosing of medications.  As long as her mother feels that she is doing well and not having frequent seizure activity, we will continue current therapy.  If she is concerned about increased seizure activity have her reach out to [place].  Her creatinine is a little elevated.  I do not see where this has been elevated in the past but may not have her full history.  I would like for her to follow-up with primary care to monitor this closely.  Sometimes dehydration can cause kidney functions to be elevated.  Make sure she is drinking plenty of water.

## 2019-04-09 NOTE — Telephone Encounter (Signed)
I relayed the results of pts labs to pts mother/ caregiver.  She stated that she has not had any sz and is doing ok.  Will keep dosing same, she will call if needed sooner.  Also creatinine little elevated, recommend pcp monitor, keep hydrated.  Will fax to [facility] to her pcp for there records. She verbalized understanding.

## 2019-04-13 NOTE — Progress Notes (Signed)
I reviewed note and agree with plan.   Penni Bombard, MD [id number], [id number] AM Certified in Neurology, Neurophysiology and Neuroimaging  [facility] [address] [place], [place] [address] [phone]

## 2019-08-31 ENCOUNTER — Other Ambulatory Visit: Payer: Self-pay | Admitting: Internal Medicine

## 2019-08-31 DIAGNOSIS — Z1231 Encounter for screening mammogram for malignant neoplasm of breast: Secondary | ICD-10-CM

## 2019-09-10 ENCOUNTER — Other Ambulatory Visit: Payer: Self-pay | Admitting: Gastroenterology

## 2019-09-29 ENCOUNTER — Other Ambulatory Visit (HOSPITAL_COMMUNITY)
Admission: RE | Admit: 2019-09-29 | Discharge: 2019-09-29 | Disposition: A | Discharge status: Home / Self Care | Payer: 59 | Source: Ambulatory Visit | Attending: Gastroenterology | Admitting: Gastroenterology

## 2019-09-29 DIAGNOSIS — Z20822 Contact with and (suspected) exposure to covid-19: Secondary | ICD-10-CM | POA: Diagnosis not present

## 2019-09-29 DIAGNOSIS — Z01812 Encounter for preprocedural laboratory examination: Secondary | ICD-10-CM | POA: Insufficient documentation

## 2019-09-29 LAB — SARS CORONAVIRUS 2 (TAT 6-24 HRS): SARS Coronavirus 2: NEGATIVE

## 2019-10-02 ENCOUNTER — Encounter (HOSPITAL_COMMUNITY): Payer: Self-pay | Admitting: Gastroenterology

## 2019-10-02 ENCOUNTER — Encounter (HOSPITAL_COMMUNITY)
Admission: RE | Disposition: A | Discharge status: Home / Self Care | Payer: Self-pay | Source: Home / Self Care | Attending: Gastroenterology

## 2019-10-02 ENCOUNTER — Ambulatory Visit (HOSPITAL_COMMUNITY): Payer: 59 | Admitting: Anesthesiology

## 2019-10-02 ENCOUNTER — Ambulatory Visit (HOSPITAL_COMMUNITY)
Admission: RE | Admit: 2019-10-02 | Discharge: 2019-10-02 | Disposition: A | Discharge status: Home / Self Care | Payer: 59 | Attending: Gastroenterology | Admitting: Gastroenterology

## 2019-10-02 ENCOUNTER — Ambulatory Visit: Payer: 59

## 2019-10-02 ENCOUNTER — Other Ambulatory Visit: Payer: Self-pay

## 2019-10-02 DIAGNOSIS — K5730 Diverticulosis of large intestine without perforation or abscess without bleeding: Secondary | ICD-10-CM | POA: Diagnosis not present

## 2019-10-02 DIAGNOSIS — Z1211 Encounter for screening for malignant neoplasm of colon: Secondary | ICD-10-CM | POA: Diagnosis not present

## 2019-10-02 DIAGNOSIS — R6250 Unspecified lack of expected normal physiological development in childhood: Secondary | ICD-10-CM | POA: Diagnosis not present

## 2019-10-02 DIAGNOSIS — D120 Benign neoplasm of cecum: Secondary | ICD-10-CM | POA: Diagnosis not present

## 2019-10-02 DIAGNOSIS — D124 Benign neoplasm of descending colon: Secondary | ICD-10-CM | POA: Diagnosis not present

## 2019-10-02 HISTORY — PX: COLONOSCOPY WITH PROPOFOL: SHX5780

## 2019-10-02 HISTORY — PX: POLYPECTOMY: SHX5525

## 2019-10-02 SURGERY — COLONOSCOPY WITH PROPOFOL
Anesthesia: Monitor Anesthesia Care

## 2019-10-02 MED ORDER — LACTATED RINGERS IV SOLN: INTRAVENOUS | Status: DC

## 2019-10-02 MED ORDER — PROPOFOL 500 MG/50ML IV EMUL
INTRAVENOUS | Status: DC | PRN
  Administered 2019-10-02: 100 ug/kg/min via INTRAVENOUS

## 2019-10-02 MED ORDER — PROPOFOL 10 MG/ML IV BOLUS
INTRAVENOUS | Status: DC | PRN
  Administered 2019-10-02: 30 mg via INTRAVENOUS

## 2019-10-02 MED ORDER — SODIUM CHLORIDE 0.9 % IV SOLN: INTRAVENOUS | Status: DC

## 2019-10-02 SURGICAL SUPPLY — 21 items

## 2019-10-02 NOTE — Discharge Instructions (Signed)

## 2019-10-02 NOTE — Anesthesia Preprocedure Evaluation (Addendum)
Anesthesia Evaluation  Patient identified by MRN, date of birth, ID band Patient awake    Reviewed: Allergy & Precautions, NPO status , Patient's Chart, lab work & pertinent test results  History of Anesthesia Complications Negative for: history of anesthetic complications  Airway Mallampati: II  TM Distance: >3 FB Neck ROM: Full    Dental   Pulmonary neg pulmonary ROS,    Pulmonary exam normal        Cardiovascular negative cardio ROS Normal cardiovascular exam     Neuro/Psych Seizures -,  Developmental delay   GI/Hepatic negative GI ROS, Neg liver ROS,   Endo/Other  negative endocrine ROS  Renal/GU negative Renal ROS  negative genitourinary   Musculoskeletal negative musculoskeletal ROS (+)   Abdominal   Peds  Hematology negative hematology ROS (+)   Anesthesia Other Findings   Reproductive/Obstetrics                            Anesthesia Physical Anesthesia Plan  ASA: II  Anesthesia Plan: MAC   Post-op Pain Management:    Induction: Intravenous  PONV Risk Score and Plan: 2 and Propofol infusion, TIVA and Treatment may vary due to age or medical condition  Airway Management Planned: Natural Airway, Nasal Cannula and Simple Face Mask  Additional Equipment: None  Intra-op Plan:   Post-operative Plan:   Informed Consent: I have reviewed the patients History and Physical, chart, labs and discussed the procedure including the risks, benefits and alternatives for the proposed anesthesia with the patient or authorized representative who has indicated his/her understanding and acceptance.     Consent reviewed with POA  Plan Discussed with:   Anesthesia Plan Comments:         Anesthesia Quick Evaluation

## 2019-10-02 NOTE — Transfer of Care (Signed)
Immediate Anesthesia Transfer of Care Note  Patient: Rebecca Gregory  Procedure(s) Performed: COLONOSCOPY WITH PROPOFOL (N/A ) POLYPECTOMY  Patient Location: PACU  Anesthesia Type:MAC  Level of Consciousness: awake, alert  and oriented  Airway & Oxygen Therapy: Patient Spontanous Breathing  Post-op Assessment: Report given to RN and Post -op Vital signs reviewed and stable  Post vital signs: Reviewed and stable  Last Vitals:  Vitals Value Taken Time  BP    Temp    Pulse    Resp    SpO2      Last Pain:  Vitals:   10/02/19 0832  TempSrc: Temporal  PainSc: 0-No pain         Complications: No complications documented.

## 2019-10-02 NOTE — Anesthesia Postprocedure Evaluation (Signed)
Anesthesia Post Note  Patient: Rebecca Gregory  Procedure(s) Performed: COLONOSCOPY WITH PROPOFOL (N/A ) POLYPECTOMY     Patient location during evaluation: Endoscopy Anesthesia Type: MAC Level of consciousness: awake and alert Pain management: pain level controlled Vital Signs Assessment: post-procedure vital signs reviewed and stable Respiratory status: spontaneous breathing, nonlabored ventilation and respiratory function stable Cardiovascular status: blood pressure returned to baseline and stable Postop Assessment: no apparent nausea or vomiting Anesthetic complications: no   No complications documented.  Last Vitals:  Vitals:   10/02/19 1030 10/02/19 1040  BP: (!) 135/72 (!) 132/70  Pulse: 61 47  Resp: 12 15  Temp:    SpO2: 100% 99%    Last Pain:  Vitals:   10/02/19 1030  TempSrc:   PainSc: 0-No pain                 Lidia Collum

## 2019-10-02 NOTE — H&P (Signed)
  [name] HPI: The patient is here for a routine colonoscopy.  She denies any issues with hematochezia, melena, abdominal pain, diarrhea, or constipation.  Past Medical History:  Diagnosis Date  . Developmental delay   . Seizures ([place])    since 2002    History reviewed. No pertinent surgical history.  History reviewed. No pertinent family history.  Social History:  reports that she has never smoked. She has never used smokeless tobacco. She reports that she does not drink alcohol and does not use drugs.  Allergies: No Known Allergies  Medications:  Scheduled:  Continuous: . lactated ringers      No results found for this or any previous visit (from the past 24 hour(s)).   No results found.  ROS:  As stated above in the HPI otherwise negative.  Blood pressure 128/68, pulse 69, temperature (!) 97.4 F (36.3 C), temperature source Temporal, resp. rate 19, height 5\' 5"  (1.651 m), weight 58.5 kg, SpO2 100 %.    PE: Gen: NAD, Alert and Oriented HEENT:  [place]/AT, EOMI Neck: Supple, no LAD Lungs: CTA Bilaterally CV: RRR without M/G/R ABD: Soft, NTND, +BS Ext: No C/C/E  Assessment/Plan: 1) Screening colonoscopy.  Deah Ottaway D 10/02/2019, 9:16 AM

## 2019-10-02 NOTE — Op Note (Signed)
[facility] Patient Name: Rebecca Gregory Procedure Date: 10/02/2019 MRN: [id number] Attending MD: Carol Ada , MD Date of Birth: 01-16-1971 CSN: [id number] Age: 49 Admit Type: Outpatient Procedure:                Colonoscopy Indications:              Screening for colorectal malignant neoplasm Providers:                Carol Ada, MD, Benetta Spar RN, RN, Cherylynn Ridges, Technician, Stephanie [place] CRNA Referring MD:              Medicines:                Propofol per Anesthesia Complications:            No immediate complications. Estimated Blood Loss:     Estimated blood loss: none. Estimated blood loss:                            none. Estimated blood loss was minimal. Procedure:                Pre-Anesthesia Assessment:                           - Prior to the procedure, a History and Physical                            was performed, and patient medications and                            allergies were reviewed. The patient's tolerance of                            previous anesthesia was also reviewed. The risks                            and benefits of the procedure and the sedation                            options and risks were discussed with the patient.                            All questions were answered, and informed consent                            was obtained. Prior Anticoagulants: The patient has                            taken no previous anticoagulant or antiplatelet                            agents. ASA Grade Assessment: II - A patient with  mild systemic disease. After reviewing the risks                            and benefits, the patient was deemed in                            satisfactory condition to undergo the procedure.                           - Sedation was administered by an anesthesia                            professional. Deep sedation was attained.                            After obtaining informed consent, the colonoscope                            was passed under direct vision. Throughout the                            procedure, the patient's blood pressure, pulse, and                            oxygen saturations were monitored continuously. The                            CF-HQ190L ([id number]) Olympus colonoscope was                            introduced through the anus and advanced to the the                            cecum, identified by appendiceal orifice and                            ileocecal valve. The colonoscopy was performed                            without difficulty. The patient tolerated the                            procedure well. The quality of the bowel                            preparation was excellent. The ileocecal valve,                            appendiceal orifice, and rectum were photographed. Scope In: 9:44:29 AM Scope Out: 10:08:51 AM Scope Withdrawal Time: 0 hours 17 minutes 56 seconds  Total Procedure Duration: 0 hours 24 minutes 22 seconds  Findings:      Two sessile polyps were found in the descending colon and cecum. The       polyps were 3 to 5 mm in size.  These polyps were removed with a cold       snare. Resection and retrieval were complete.      A few small-mouthed diverticula were found in the sigmoid colon. Impression:               - Two 3 to 5 mm polyps in the descending colon and                            in the cecum, removed with a cold snare. Resected                            and retrieved.                           - Diverticulosis in the sigmoid colon. Moderate Sedation:      Not Applicable - Patient had care per Anesthesia. Recommendation:           - Patient has a contact number available for                            emergencies. The signs and symptoms of potential                            delayed complications were discussed with the                            patient. Return to  normal activities tomorrow.                            Written discharge instructions were provided to the                            patient.                           - Resume regular diet.                           - Continue present medications.                           - Await pathology results.                           - Repeat colonoscopy in 7 years for surveillance. Procedure Code(s):        --- Professional ---                           [phone], Colonoscopy, flexible; with removal of                            tumor(s), polyp(s), or other lesion(s) by snare                            technique Diagnosis Code(s):        --- Professional ---  K63.5, Polyp of colon                           Z12.11, Encounter for screening for malignant                            neoplasm of colon                           K57.30, Diverticulosis of large intestine without                            perforation or abscess without bleeding CPT copyright 2019 American Medical Association. All rights reserved. The codes documented in this report are preliminary and upon coder review may  be revised to meet current compliance requirements. Carol Ada, MD Carol Ada, MD 10/02/2019 10:26:42 AM This report has been signed electronically. Number of Addenda: 0

## 2019-10-03 ENCOUNTER — Encounter (HOSPITAL_COMMUNITY): Payer: Self-pay | Admitting: Gastroenterology

## 2019-10-05 ENCOUNTER — Other Ambulatory Visit: Payer: Self-pay

## 2019-10-05 ENCOUNTER — Ambulatory Visit
Admission: RE | Admit: 2019-10-05 | Discharge: 2019-10-05 | Disposition: A | Discharge status: Home / Self Care | Payer: 59 | Source: Ambulatory Visit | Attending: Internal Medicine | Admitting: Internal Medicine

## 2019-10-05 DIAGNOSIS — Z1231 Encounter for screening mammogram for malignant neoplasm of breast: Secondary | ICD-10-CM

## 2019-10-05 LAB — SURGICAL PATHOLOGY

## 2019-10-07 ENCOUNTER — Other Ambulatory Visit: Payer: Self-pay | Admitting: Internal Medicine

## 2019-10-07 DIAGNOSIS — R928 Other abnormal and inconclusive findings on diagnostic imaging of breast: Secondary | ICD-10-CM

## 2019-10-12 ENCOUNTER — Ambulatory Visit
Admission: RE | Admit: 2019-10-12 | Discharge: 2019-10-12 | Disposition: A | Discharge status: Home / Self Care | Payer: 59 | Source: Ambulatory Visit | Attending: Internal Medicine | Admitting: Internal Medicine

## 2019-10-12 ENCOUNTER — Other Ambulatory Visit: Payer: Self-pay

## 2019-10-12 DIAGNOSIS — R928 Other abnormal and inconclusive findings on diagnostic imaging of breast: Secondary | ICD-10-CM

## 2020-04-07 ENCOUNTER — Other Ambulatory Visit: Payer: Self-pay

## 2020-04-07 ENCOUNTER — Encounter: Payer: Self-pay | Admitting: Family Medicine

## 2020-04-07 ENCOUNTER — Ambulatory Visit (INDEPENDENT_AMBULATORY_CARE_PROVIDER_SITE_OTHER): Payer: 59 | Admitting: Family Medicine

## 2020-04-07 VITALS — BP 109/61 | HR 63 | Ht 66.0 in | Wt 155.0 lb

## 2020-04-07 DIAGNOSIS — R569 Unspecified convulsions: Secondary | ICD-10-CM | POA: Diagnosis not present

## 2020-04-07 DIAGNOSIS — Z760 Encounter for issue of repeat prescription: Secondary | ICD-10-CM | POA: Diagnosis not present

## 2020-04-07 DIAGNOSIS — Z5181 Encounter for therapeutic drug level monitoring: Secondary | ICD-10-CM

## 2020-04-07 MED ORDER — DIVALPROEX SODIUM 500 MG PO DR TAB: 500.0000 mg | DELAYED_RELEASE_TABLET | Freq: Two times a day (BID) | ORAL | 3 refills | Status: DC

## 2020-04-07 MED ORDER — OXCARBAZEPINE 600 MG PO TABS: 600.0000 mg | ORAL_TABLET | Freq: Two times a day (BID) | ORAL | 3 refills | Status: DC

## 2020-04-07 MED ORDER — LEVETIRACETAM 1000 MG PO TABS: 1000.0000 mg | ORAL_TABLET | Freq: Two times a day (BID) | ORAL | 3 refills | Status: DC

## 2020-04-07 NOTE — Progress Notes (Signed)
PATIENT: Rebecca Gregory DOB: Sep 03, 1970  REASON FOR VISIT: follow up HISTORY FROM: patient  Chief Complaint  Patient presents with  . Follow-up    RM 1 with (dad) jemms Pt is well, no seizure since last visit per dad     HISTORY OF PRESENT ILLNESS: Today 04/07/20  She returns today for follow up. She continues divalproex 500mg  BID, levetiracetam 1000mg  BID and oxcarbamazepine 600mg  BID.  She is doing very well since last being seen.  No seizure activity.  She is tolerating medication well with no obvious adverse effects.  Tremor remains very mild.  Dad does note worsening when she is anxious.  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  She is fully vaccinated.  She is feeling well today and without concerns.   04/08/2019 ALL: Rebecca Gregory is a 50 y.o. female here today for follow up for seizure disorder. She continues divalproex 500mg  BID, levetiracetam 1000mg  BID and oxcarbamazepine 600mg  twice daily. She is tolerating medications well. She has a very mild tremor of both hands that has remained stable. Mom reports she may have one small seizure each month with onset of menstrual cycle. She is not on birth control. She had a tubal ligation years ago. She has a great appetite. She stays well hydrated. She is working part time for a senior living facility. She lives with mom, Joseph Art, and dad, Jemes. She is able to dress and bathe herself. She does not drive. She can prepare meals in microwave but does not cook. She is feeling well today and without concerns.    HISTORY: (copied from [employer] note on 04/02/2018)  UPDATE 1/22/2020[name], 50 year old female returns for follow-up with history of seizure disorder.  She remains on Depakote Keppra and Trileptal without side effects.  She continues to have a few small seizures during her menstrual cycle.  No generalized seizures.  No falls no balance issues.  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   She does not drive.  She returns for reevaluation no recent labs.   UPDATE 7/10/2019[name], 50 year old female returns for follow-up with a history of seizure disorder.  She continues to have small seizures with her menstrual cycles.  No grand mal seizures.  She remains on Depakote Keppra and Trileptal without side effects.  No new neurologic complaints.  Reviewed recent CBC and CMP from primary care 05/02/2017 WNL.  She returns for reevaluation  UPDATE (03/20/17, VRP): Since last visit, doingwell. Toleratingmeds. No alleviating or aggravating factors.Small petit mal seizures continue prior to menstrual cycle. Some mild intermittent tremor.   PRIOR HPI (07/11/16):50 year old female here for evaluation of seizure disorder. Patient has history of developmental delay. In 2002 she had first seizure of her life, with generalized convulsions, tonic-clonic, with incontinence. Patient went to the hospital for evaluation. She had a second seizure within 1 month and was started on medication. Parents think she was started on Dilantin at that time. Over the years a chin transition to other antiseizure medications and currently takes oxcarbazepine, Depakote, levetiracetam. Since that time she is doing well. She has had one grand mal seizure in March 2018. Patient has had 5 total grand mal seizures in her life. She does have 1-2 "mini seizures" per month where she has staring, jittery sensation, abnormal mouth movements, makes a grunting noise or sound. These typically proceed her menstrual cycle.  Patient and her family used to live in [place] and recently moved to [place] in November  2017.   REVIEW OF SYSTEMS: Out of a complete 14 system review of symptoms, the patient complains only of the following symptoms, seizure, tremor and all other reviewed systems are negative.  ALLERGIES: No Known Allergies  HOME MEDICATIONS: Outpatient Medications Prior to Visit  Medication Sig Dispense Refill  .  b complex vitamins tablet Take 1 tablet by mouth daily.    . fluticasone (FLONASE) 50 MCG/ACT nasal spray Place 1 spray into both nostrils daily.    . Multiple Vitamins-Minerals (MULTIVITAMIN WITH MINERALS) tablet Take 1 tablet by mouth daily.    . divalproex (DEPAKOTE) 500 MG DR tablet Take 1 tablet (500 mg total) by mouth 2 (two) times daily. 180 tablet 3  . levETIRAcetam (KEPPRA) 1000 MG tablet Take 1 tablet (1,000 mg total) by mouth 2 (two) times daily. 180 tablet 3  . oxcarbazepine (TRILEPTAL) 600 MG tablet Take 1 tablet (600 mg total) by mouth 2 (two) times daily. 180 tablet 3   No facility-administered medications prior to visit.    PAST MEDICAL HISTORY: Past Medical History:  Diagnosis Date  . Developmental delay   . Seizures ([place])    since 2002    PAST SURGICAL HISTORY: Past Surgical History:  Procedure Laterality Date  . COLONOSCOPY WITH PROPOFOL N/A 10/02/2019   Procedure: COLONOSCOPY WITH PROPOFOL;  Surgeon: Carol Ada, MD;  Location: [facility] ENDOSCOPY;  Service: Endoscopy;  Laterality: N/A;  . POLYPECTOMY  10/02/2019   Procedure: POLYPECTOMY;  Surgeon: Carol Ada, MD;  Location: [facility] ENDOSCOPY;  Service: Endoscopy;;    FAMILY HISTORY: History reviewed. No pertinent family history.  SOCIAL HISTORY: Social History   Socioeconomic History  . Marital status: Single    Spouse name: Not on file  . Number of children: 0  . Years of education: 64  . Highest education level: Not on file  Occupational History    Comment: NA  Tobacco Use  . Smoking status: Never Smoker  . Smokeless tobacco: Never Used  Substance and Sexual Activity  . Alcohol use: No  . Drug use: No  . Sexual activity: Not Currently  Other Topics Concern  . Not on file  Social History Narrative   Lives with parents   No caffeine   Social Determinants of Health   Financial Resource Strain: Not on file  Food Insecurity: Not on file  Transportation Needs: Not on file  Physical Activity: Not on  file  Stress: Not on file  Social Connections: Not on file  Intimate Partner Violence: Not on file      PHYSICAL EXAM  Vitals:   04/07/20 1319  BP: 109/61  Pulse: 63  Weight: 155 lb (70.3 kg)  Height: 5\' 6"  (1.676 m)   Body mass index is 25.02 kg/m.  Generalized: Well developed, in no acute distress  Cardiology: normal rate and rhythm, no murmur noted Respiratory: clear to auscultation bilaterally  Neurological examination  Mentation: Alert oriented to time, place, history taking. Follows all commands speech and language fluent Cranial nerve II-XII: Pupils were equal round reactive to light. Extraocular movements were full, visual field were full on confrontational test. Facial sensation and strength were normal. Uvula tongue midline. Head turning and shoulder shrug  were normal and symmetric. Motor: The motor testing reveals 5 over 5 strength of all 4 extremities. Good symmetric motor tone is noted throughout. Mild tremor of left thumb  Sensory: Sensory testing is intact to soft touch on all 4 extremities. No evidence of extinction is noted.  Coordination: Cerebellar testing reveals  good finger-nose-finger and heel-to-shin bilaterally.  Gait and station: Gait is normal. eep tendon reflexes are symmetric and normal bilaterally.   DIAGNOSTIC DATA (LABS, IMAGING, TESTING) - I reviewed patient records, labs, notes, testing and imaging myself where available.  No flowsheet data found.   Lab Results  Component Value Date   WBC 4.8 04/08/2019   HGB 11.9 04/08/2019   HCT 35.2 04/08/2019   MCV 82 04/08/2019   PLT 254 04/08/2019      Component Value Date/Time   NA 141 04/08/2019 1413   K 4.6 04/08/2019 1413   CL 107 (H) 04/08/2019 1413   CO2 23 04/08/2019 1413   GLUCOSE 92 04/08/2019 1413   BUN 18 04/08/2019 1413   CREATININE 1.15 (H) 04/08/2019 1413   CALCIUM 9.4 04/08/2019 1413   PROT 6.5 04/08/2019 1413   ALBUMIN 4.3 04/08/2019 1413   AST 13 04/08/2019 1413   ALT 9  04/08/2019 1413   ALKPHOS 43 04/08/2019 1413   BILITOT <0.2 04/08/2019 1413   GFRNONAA 57 (L) 04/08/2019 1413   GFRAA 66 04/08/2019 1413   No results found for: CHOL, HDL, LDLCALC, LDLDIRECT, TRIG, CHOLHDL No results found for: HGBA1C No results found for: VITAMINB12 No results found for: TSH     ASSESSMENT AND PLAN 50 y.o. year old female  has a past medical history of Developmental delay and Seizures ([place]). here with     ICD-10-CM   1. Seizures ([place])  R56.9 CBC with Differential/Platelets    CMP    Carbamazepine level, total    divalproex (DEPAKOTE) 500 MG DR tablet    levETIRAcetam (KEPPRA) 1000 MG tablet    oxcarbazepine (TRILEPTAL) 600 MG tablet  2. Therapeutic drug monitoring  Z51.81 CBC with Differential/Platelets    CMP    Carbamazepine level, total  3. Medication refill  Z76.0 divalproex (DEPAKOTE) 500 MG DR tablet    levETIRAcetam (KEPPRA) 1000 MG tablet    oxcarbazepine (TRILEPTAL) 600 MG tablet     Aija is doing very well. We will continue divalproex 500mg  BID, levetiracetam 1000mg  BID and oxcarbamazepine 600mg  twice daily. I will update labs today. Refills have been sent to pharmacy. We will continue to monitor closely. Adequate hydration and seizure precautions discussed. She will follow up in 1 year, sooner if needed. She and dad verbalize understanding and agreement with this plan.    Orders Placed This Encounter  Procedures  . CBC with Differential/Platelets  . CMP  . Carbamazepine level, total     Meds ordered this encounter  Medications  . divalproex (DEPAKOTE) 500 MG DR tablet    Sig: Take 1 tablet (500 mg total) by mouth 2 (two) times daily.    Dispense:  180 tablet    Refill:  3    Order Specific Question:   Supervising Provider    Answer:   Melvenia Beam [id number]  . levETIRAcetam (KEPPRA) 1000 MG tablet    Sig: Take 1 tablet (1,000 mg total) by mouth 2 (two) times daily.    Dispense:  180 tablet    Refill:  3    Order Specific  Question:   Supervising Provider    Answer:   Melvenia Beam [id number]  . oxcarbazepine (TRILEPTAL) 600 MG tablet    Sig: Take 1 tablet (600 mg total) by mouth 2 (two) times daily.    Dispense:  180 tablet    Refill:  3    Order Specific Question:   Supervising Provider    Answer:  Melvenia Beam [id number]      Debbora Presto, FNP-C 04/07/2020, 1:43 PM [facility] [address] [place], [place] [address] [phone]

## 2020-04-07 NOTE — Patient Instructions (Addendum)
Below is our plan:  We will continue divalproex DR 500mg , levetiracetam 1000mg  and oxcarbamazepine 600mg  twice daily. I will update labs today.   Please make sure you are staying well hydrated. I recommend 50-60 ounces daily. Well balanced diet and regular exercise encouraged.    Please continue follow up with care team as directed.   Follow up with me in 1 year   You may receive a survey regarding today's visit. I encourage you to leave honest feed back as I do use this information to improve patient care. Thank you for seeing me today!      Seizure, Adult A seizure is a sudden burst of abnormal electrical and chemical activity in the brain. Seizures usually last from 30 seconds to 2 minutes.  What are the causes? Common causes of this condition include:  Fever or infection.  Problems that affect the brain. These may include: ? A brain or head injury. ? Bleeding in the brain. ? A brain tumor.  Low levels of blood sugar or salt.  Kidney problems or liver problems.  Conditions that are passed from parent to child (are inherited).  Problems with a substance, such as: ? Having a reaction to a drug or a medicine. ? Stopping the use of a substance all of a sudden (withdrawal).  A stroke.  Disorders that affect how you develop. Sometimes, the cause may not be known.  What increases the risk?  Having someone in your family who has epilepsy. In this condition, seizures happen again and again over time. They have no clear cause.  Having had a tonic-clonic seizure before. This type of seizure causes you to: ? Tighten the muscles of the whole body. ? Lose consciousness.  Having had a head injury or strokes before.  Having had a lack of oxygen at birth. What are the signs or symptoms? There are many types of seizures. The symptoms vary depending on the type of seizure you have. Symptoms during a seizure  Shaking that you cannot control (convulsions) with fast, jerky  movements of muscles.  Stiffness of the body.  Breathing problems.  Feeling mixed up (confused).  Staring or not responding to sound or touch.  Head nodding.  Eyes that blink, flutter, or move fast.  Drooling, grunting, or making clicking sounds with your mouth  Losing control of when you pee or poop. Symptoms before a seizure  Feeling afraid, nervous, or worried.  Feeling like you may vomit.  Feeling like: ? You are moving when you are not. ? Things around you are moving when they are not.  Feeling like you saw or heard something before (dj vu).  Odd tastes or smells.  Changes in how you see. You may see flashing lights or spots. Symptoms after a seizure  Feeling confused.  Feeling sleepy.  Headache.  Sore muscles. How is this treated? If your seizure stops on its own, you will not need treatment. If your seizure lasts longer than 5 minutes, you will normally need treatment. Treatment may include:  Medicines given through an IV tube.  Avoiding things, such as medicines, that are known to cause your seizures.  Medicines to prevent seizures.  A device to prevent or control seizures.  Surgery.  A diet low in carbohydrates and high in fat (ketogenic diet). Follow these instructions at home: Medicines  Take over-the-counter and prescription medicines only as told by your doctor.  Avoid foods or drinks that may keep your medicine from working, such as alcohol. Activity  Follow instructions about driving, swimming, or doing things that would be dangerous if you had another seizure. Wait until your doctor says it is safe for you to do these things.  If you live in the [place] ask your local department of motor vehicles when you can drive.  Get a lot of rest. Teaching others  Teach friends and family what to do when you have a seizure. They should: ? Help you get down to the ground. ? Protect your head and body. ? Loosen any clothing around your  neck. ? Turn you on your side. ? Know whether or not you need emergency care. ? Stay with you until you are better.  Also, tell them what not to do if you have a seizure. Tell them: ? They should not hold you down. ? They should not put anything in your mouth.   General instructions  Avoid anything that gives you seizures.  Keep a seizure diary. Write down: ? What you remember about each seizure. ? What you think caused each seizure.  Keep all follow-up visits. Contact a doctor if:  You have another seizure or seizures. Call the doctor each time you have a seizure.  The pattern of your seizures changes.  You keep having seizures with treatment.  You have symptoms of being sick or having an infection.  You are not able to take your medicine. Get help right away if:  You have any of these problems: ? A seizure that lasts longer than 5 minutes. ? Many seizures in a row and you do not feel better between seizures. ? A seizure that makes it harder to breathe. ? A seizure and you can no longer speak or use part of your body.  You do not wake up right after a seizure.  You get hurt during a seizure.  You feel confused or have pain right after a seizure. These symptoms may be an emergency. Get help right away. Call your local emergency services (911 in the [place]  Do not wait to see if the symptoms will go away.  Do not drive yourself to the hospital. Summary  A seizure is a sudden burst of abnormal electrical and chemical activity in the brain. Seizures normally last from 30 seconds to 2 minutes.  Causes of seizures include illness, injury to the head, low levels of blood sugar or salt, and certain conditions.  Most seizures will stop on their own in less than 5 minutes. Seizures that last longer than 5 minutes are a medical emergency and need treatment right away.  Many medicines are used to treat seizures. Take over-the-counter and prescription medicines only as told  by your doctor. This information is not intended to replace advice given to you by your health care provider. Make sure you discuss any questions you have with your health care provider. Document Revised: 09/04/2019 Document Reviewed: 09/04/2019 Elsevier Patient Education  [place].

## 2020-04-08 LAB — COMPREHENSIVE METABOLIC PANEL
ALT: 11 IU/L (ref 0–32)
AST: 12 IU/L (ref 0–40)
Albumin/Globulin Ratio: 1.9 (ref 1.2–2.2)
Albumin: 4.4 g/dL (ref 3.8–4.8)
Alkaline Phosphatase: 38 IU/L — ABNORMAL LOW (ref 44–121)
BUN/Creatinine Ratio: 31 — ABNORMAL HIGH (ref 9–23)
BUN: 18 mg/dL (ref 6–24)
Bilirubin Total: <0.2 mg/dL (ref 0.0–1.2)
CO2: 21 mmol/L (ref 20–29)
Calcium: 9.5 mg/dL (ref 8.7–10.2)
Chloride: 108 mmol/L — ABNORMAL HIGH (ref 96–106)
Creatinine, Ser: 0.58 mg/dL (ref 0.57–1.00)
GFR calc Af Amer: 127 mL/min/{1.73_m2} (ref >59)
GFR calc non Af Amer: 110 mL/min/{1.73_m2} (ref >59)
Globulin, Total: 2.3 g/dL (ref 1.5–4.5)
Glucose: 84 mg/dL (ref 65–99)
Potassium: 4.5 mmol/L (ref 3.5–5.2)
Sodium: 142 mmol/L (ref 134–144)
Total Protein: 6.7 g/dL (ref 6.0–8.5)

## 2020-04-08 LAB — CBC WITH DIFFERENTIAL/PLATELET
Basophils Absolute: 0 10*3/uL (ref 0.0–0.2)
Basos: 1 %
EOS (ABSOLUTE): 0.1 10*3/uL (ref 0.0–0.4)
Eos: 3 %
Hematocrit: 36.4 % (ref 34.0–46.6)
Hemoglobin: 12.8 g/dL (ref 11.1–15.9)
Immature Grans (Abs): 0 10*3/uL (ref 0.0–0.1)
Immature Granulocytes: 0 %
Lymphocytes Absolute: 1.2 10*3/uL (ref 0.7–3.1)
Lymphs: 27 %
MCH: 29.8 pg (ref 26.6–33.0)
MCHC: 35.2 g/dL (ref 31.5–35.7)
MCV: 85 fL (ref 79–97)
Monocytes Absolute: 0.6 10*3/uL (ref 0.1–0.9)
Monocytes: 14 %
Neutrophils Absolute: 2.4 10*3/uL (ref 1.4–7.0)
Neutrophils: 55 %
Platelets: 262 10*3/uL (ref 150–450)
RBC: 4.3 x10E6/uL (ref 3.77–5.28)
RDW: 12.8 % (ref 11.7–15.4)
WBC: 4.4 10*3/uL (ref 3.4–10.8)

## 2020-04-08 LAB — CARBAMAZEPINE LEVEL, TOTAL: Carbamazepine (Tegretol), S: <2 ug/mL — ABNORMAL LOW (ref 4.0–12.0)

## 2020-04-12 ENCOUNTER — Telehealth: Payer: Self-pay | Admitting: *Deleted

## 2020-04-12 NOTE — Telephone Encounter (Signed)
-----   Message from Debbora Presto, NP sent at 04/12/2020  9:21 AM EST ----- Labs look ok. Carbamazepine level was low but could have been due to timing of last dose. We will continue current treatment plan.

## 2020-04-12 NOTE — Telephone Encounter (Signed)
Spoke to her mother, Joseph Art, and provided her with the lab results. She verbalized understanding to continue current treatment plan.

## 2020-04-18 NOTE — Progress Notes (Signed)
I reviewed note and agree with plan.   Penni Bombard, MD [id number], [id number] PM Certified in Neurology, Neurophysiology and Neuroimaging  [facility] [address] [place],  [address] [phone]

## 2020-09-02 ENCOUNTER — Other Ambulatory Visit: Payer: Self-pay | Admitting: Internal Medicine

## 2020-09-02 DIAGNOSIS — Z1231 Encounter for screening mammogram for malignant neoplasm of breast: Secondary | ICD-10-CM

## 2020-10-26 ENCOUNTER — Ambulatory Visit
Admission: RE | Admit: 2020-10-26 | Discharge: 2020-10-26 | Disposition: A | Discharge status: Home / Self Care | Payer: 59 | Source: Ambulatory Visit | Attending: Internal Medicine | Admitting: Internal Medicine

## 2020-10-26 DIAGNOSIS — Z1231 Encounter for screening mammogram for malignant neoplasm of breast: Secondary | ICD-10-CM

## 2021-04-10 ENCOUNTER — Ambulatory Visit (INDEPENDENT_AMBULATORY_CARE_PROVIDER_SITE_OTHER): Payer: 59 | Admitting: Family Medicine

## 2021-04-10 ENCOUNTER — Encounter: Payer: Self-pay | Admitting: Family Medicine

## 2021-04-10 DIAGNOSIS — R569 Unspecified convulsions: Secondary | ICD-10-CM | POA: Diagnosis not present

## 2021-04-10 MED ORDER — OXCARBAZEPINE 600 MG PO TABS: 600.0000 mg | ORAL_TABLET | Freq: Two times a day (BID) | ORAL | 3 refills | Status: DC

## 2021-04-10 MED ORDER — LEVETIRACETAM 1000 MG PO TABS: 1000.0000 mg | ORAL_TABLET | Freq: Two times a day (BID) | ORAL | 3 refills | Status: DC

## 2021-04-10 MED ORDER — DIVALPROEX SODIUM 500 MG PO DR TAB: 500.0000 mg | DELAYED_RELEASE_TABLET | Freq: Two times a day (BID) | ORAL | 3 refills | Status: DC

## 2021-04-10 NOTE — Progress Notes (Signed)
PATIENT: Rebecca Gregory DOB: May 28, 1970  REASON FOR VISIT: follow up HISTORY FROM: patient  Chief Complaint  Patient presents with   Follow-up    Rm 61, w father. Here for yearly sz f/u. Pt reports no sz like activity since last OV. Pt did have a sz 3 weeks ago when traveling out of state. Pt has been taking medication regularly. Needs refills today.      HISTORY OF PRESENT ILLNESS: 04/10/21 ALL: Rebecca Gregory returns for follow up for seizures. She continues divalproex 500mg  BID, levetiracetam 1000mg  BID and oxcarbamazepine 600mg  twice daily. She is tolerating meds well. No missed doses. She did have one event where she was unresponsive and jaw was clenched. She was traveling with her mom and dad to [facility]. Event lasted a few minutes and then she went to sleep. She was back to baseline when she woke. No other events. No generalized seizures. She continues to work part time. She is feeling well, today.   04/07/2020 ALL:  She returns today for follow up. She continues divalproex 500mg  BID, levetiracetam 1000mg  BID and oxcarbamazepine 600mg  BID.  She is doing very well since last being seen.  No seizure activity.  She is tolerating medication well with no obvious adverse effects.  Tremor remains very mild.  Dad does note worsening when she is anxious.  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  She is fully vaccinated.  She is feeling well today and without concerns.  04/08/2019 ALL: Rebecca Gregory is a 51 y.o. female here today for follow up for seizure disorder. She continues divalproex 500mg  BID, levetiracetam 1000mg  BID and oxcarbamazepine 600mg  twice daily. She is tolerating medications well. She has a very mild tremor of both hands that has remained stable. Mom reports she may have one small seizure each month with onset of menstrual cycle. She is not on birth control. She had a tubal ligation years ago. She has a great appetite. She stays well hydrated. She is working part  time for a senior living facility. She lives with mom, Rebecca Gregory, and dad, Rebecca Gregory. She is able to dress and bathe herself. She does not drive. She can prepare meals in microwave but does not cook. She is feeling well today and without concerns.    HISTORY: (copied from [employer] note on 04/02/2018)  UPDATE 1/22/2020[name], 51 year old female returns for follow-up with history of seizure disorder.  She remains on Depakote Keppra and Trileptal without side effects.  She continues to have a few small seizures during her menstrual cycle.  No generalized seizures.  No falls no balance issues.  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  She does not drive.  She returns for reevaluation no recent labs.   UPDATE 7/10/2019[name], 51 year old female returns for follow-up with a history of seizure disorder.  She continues to have small seizures with her menstrual cycles.  No grand mal seizures.  She remains on Depakote Keppra and Trileptal without side effects.  No new neurologic complaints.  Reviewed recent CBC and CMP from primary care 05/02/2017 WNL.  She returns for reevaluation   UPDATE (03/20/17, VRP): Since last visit, doing well. Tolerating meds. No alleviating or aggravating factors. Small petit mal seizures continue prior to menstrual cycle. Some mild intermittent tremor.    PRIOR HPI (07/11/16): 51 year old female here for evaluation of seizure disorder. Patient has history of developmental delay. In 2002 she had first seizure of her life, with generalized convulsions, tonic-clonic,  with incontinence. Patient went to the hospital for evaluation. She had a second seizure within 1 month and was started on medication. Parents think she was started on Dilantin at that time. Over the years a chin transition to other antiseizure medications and currently takes oxcarbazepine, Depakote, levetiracetam. Since that time she is doing well. She has had one grand mal seizure in March 2018.  Patient has had 5 total grand mal seizures in her life. She does have 1-2 "mini seizures" per month where she has staring, jittery sensation, abnormal mouth movements, makes a grunting noise or sound. These typically proceed her menstrual cycle.   Patient and her family used to live in [place] and recently moved to [place] in November 2017.   REVIEW OF SYSTEMS: Out of a complete 14 system review of symptoms, the patient complains only of the following symptoms, seizure, tremor and all other reviewed systems are negative.  ALLERGIES: No Known Allergies  HOME MEDICATIONS: Outpatient Medications Prior to Visit  Medication Sig Dispense Refill   b complex vitamins tablet Take 1 tablet by mouth daily.     fluticasone (FLONASE) 50 MCG/ACT nasal spray Place 1 spray into both nostrils daily.     Multiple Vitamins-Minerals (MULTIVITAMIN WITH MINERALS) tablet Take 1 tablet by mouth daily.     divalproex (DEPAKOTE) 500 MG DR tablet Take 1 tablet (500 mg total) by mouth 2 (two) times daily. 180 tablet 3   levETIRAcetam (KEPPRA) 1000 MG tablet Take 1 tablet (1,000 mg total) by mouth 2 (two) times daily. 180 tablet 3   oxcarbazepine (TRILEPTAL) 600 MG tablet Take 1 tablet (600 mg total) by mouth 2 (two) times daily. 180 tablet 3   No facility-administered medications prior to visit.    PAST MEDICAL HISTORY: Past Medical History:  Diagnosis Date   Developmental delay    Seizures ([place])    since 2002    PAST SURGICAL HISTORY: Past Surgical History:  Procedure Laterality Date   COLONOSCOPY WITH PROPOFOL N/A 10/02/2019   Procedure: COLONOSCOPY WITH PROPOFOL;  Surgeon: Carol Ada, MD;  Location: [facility]COPY;  Service: Endoscopy;  Laterality: N/A;   POLYPECTOMY  10/02/2019   Procedure: POLYPECTOMY;  Surgeon: Carol Ada, MD;  Location: [facility];  Service: Endoscopy;;    FAMILY HISTORY: History reviewed. No pertinent family history.  SOCIAL HISTORY: Social History    Socioeconomic History   Marital status: Single    Spouse name: Not on file   Number of children: 0   Years of education: 12   Highest education level: Not on file  Occupational History    Comment: NA  Tobacco Use   Smoking status: Never   Smokeless tobacco: Never  Substance and Sexual Activity   Alcohol use: No   Drug use: No   Sexual activity: Not Currently  Other Topics Concern   Not on file  Social History Narrative   Lives with parents   No caffeine   Social Determinants of Health   Financial Resource Strain: Not on file  Food Insecurity: Not on file  Transportation Needs: Not on file  Physical Activity: Not on file  Stress: Not on file  Social Connections: Not on file  Intimate Partner Violence: Not on file      PHYSICAL EXAM  Vitals:   04/10/21 1251  BP: 116/67  Pulse: 71  Weight: 148 lb 8 oz (67.4 kg)  Height: 5\' 6"  (1.676 m)   Body mass index is 23.97 kg/m.  Generalized: Well developed, in no  acute distress  Cardiology: normal rate and rhythm, no murmur noted Respiratory: clear to auscultation bilaterally  Neurological examination  Mentation: Alert oriented to time, place, history taking. Follows all commands speech and language fluent Cranial nerve II-XII: Pupils were equal round reactive to light. Extraocular movements were full, visual field were full on confrontational test. Facial sensation and strength were normal. Head turning and shoulder shrug  were normal and symmetric. Motor: The motor testing reveals 5 over 5 strength of all 4 extremities. Good symmetric motor tone is noted throughout. Mild tremor of left thumb  Sensory: Sensory testing is intact to soft touch on all 4 extremities. No evidence of extinction is noted.  Gait and station: Gait is normal.   DIAGNOSTIC DATA (LABS, IMAGING, TESTING) - I reviewed patient records, labs, notes, testing and imaging myself where available.  No flowsheet data found.   Lab Results  Component  Value Date   WBC 4.4 04/07/2020   HGB 12.8 04/07/2020   HCT 36.4 04/07/2020   MCV 85 04/07/2020   PLT 262 04/07/2020      Component Value Date/Time   NA 142 04/07/2020 1344   K 4.5 04/07/2020 1344   CL 108 (H) 04/07/2020 1344   CO2 21 04/07/2020 1344   GLUCOSE 84 04/07/2020 1344   BUN 18 04/07/2020 1344   CREATININE 0.58 04/07/2020 1344   CALCIUM 9.5 04/07/2020 1344   PROT 6.7 04/07/2020 1344   ALBUMIN 4.4 04/07/2020 1344   AST 12 04/07/2020 1344   ALT 11 04/07/2020 1344   ALKPHOS 38 (L) 04/07/2020 1344   BILITOT <0.2 04/07/2020 1344   GFRNONAA 110 04/07/2020 1344   GFRAA 127 04/07/2020 1344   No results found for: CHOL, HDL, LDLCALC, LDLDIRECT, TRIG, CHOLHDL No results found for: HGBA1C No results found for: VITAMINB12 No results found for: TSH     ASSESSMENT AND PLAN 51 y.o. year old female  has a past medical history of Developmental delay and Seizures ([place]). here with     ICD-10-CM   1. Seizures ([place])  R56.9 Valproic Acid Level    10-Hydroxycarbazepine       Rebecca Gregory is doing very well. We will continue divalproex 500mg  BID, levetiracetam 1000mg  BID and oxcarbamazepine 600mg  twice daily. I will update labs today. Refills have been sent to pharmacy. We will continue to monitor closely. Adequate hydration and seizure precautions discussed. She does not drive. She will follow up in 1 year, sooner if needed. She and dad verbalize understanding and agreement with this plan.    Orders Placed This Encounter  Procedures   Valproic Acid Level   10-Hydroxycarbazepine     Meds ordered this encounter  Medications   divalproex (DEPAKOTE) 500 MG DR tablet    Sig: Take 1 tablet (500 mg total) by mouth 2 (two) times daily.    Dispense:  180 tablet    Refill:  3   levETIRAcetam (KEPPRA) 1000 MG tablet    Sig: Take 1 tablet (1,000 mg total) by mouth 2 (two) times daily.    Dispense:  180 tablet    Refill:  3   oxcarbazepine (TRILEPTAL) 600 MG tablet    Sig: Take 1  tablet (600 mg total) by mouth 2 (two) times daily.    Dispense:  180 tablet    Refill:  3      Shany Marinez, FNP-C 04/10/2021, 1:29 PM [facility] [address], [place], [place] [address] [phone]

## 2021-04-10 NOTE — Patient Instructions (Signed)
Below is our plan:  We will continue current treatment plan. Please let me know if you need me!  Please make sure you are staying well hydrated. I recommend 50-60 ounces daily. Well balanced diet and regular exercise encouraged. Consistent sleep schedule with 6-8 hours recommended.   Please continue follow up with care team as directed.   Follow up with me in 1 year  You may receive a survey regarding today's visit. I encourage you to leave honest feed back as I do use this information to improve patient care. Thank you for seeing me today!

## 2021-04-12 LAB — VALPROIC ACID LEVEL: Valproic Acid Lvl: 72 ug/mL (ref 50–100)

## 2021-04-12 LAB — 10-HYDROXYCARBAZEPINE: Oxcarbazepine SerPl-Mcnc: 17 ug/mL (ref 10–35)

## 2021-09-27 ENCOUNTER — Other Ambulatory Visit: Payer: Self-pay | Admitting: Internal Medicine

## 2021-09-27 DIAGNOSIS — Z1231 Encounter for screening mammogram for malignant neoplasm of breast: Secondary | ICD-10-CM

## 2021-10-27 ENCOUNTER — Ambulatory Visit
Admission: RE | Admit: 2021-10-27 | Discharge: 2021-10-27 | Disposition: A | Discharge status: Home / Self Care | Payer: 59 | Source: Ambulatory Visit | Attending: Internal Medicine | Admitting: Internal Medicine

## 2021-10-27 DIAGNOSIS — Z1231 Encounter for screening mammogram for malignant neoplasm of breast: Secondary | ICD-10-CM

## 2022-04-04 ENCOUNTER — Other Ambulatory Visit: Payer: Self-pay | Admitting: *Deleted

## 2022-04-04 MED ORDER — OXCARBAZEPINE 600 MG PO TABS: 600.0000 mg | ORAL_TABLET | Freq: Two times a day (BID) | ORAL | 0 refills | Status: DC

## 2022-04-04 MED ORDER — DIVALPROEX SODIUM 500 MG PO DR TAB: 500.0000 mg | DELAYED_RELEASE_TABLET | Freq: Two times a day (BID) | ORAL | 0 refills | Status: DC

## 2022-04-04 MED ORDER — LEVETIRACETAM 1000 MG PO TABS: 1000.0000 mg | ORAL_TABLET | Freq: Two times a day (BID) | ORAL | 0 refills | Status: DC

## 2022-04-04 NOTE — Telephone Encounter (Signed)
Patient has follow up with Amy Lomax 04/11/22

## 2022-04-11 ENCOUNTER — Encounter: Payer: Self-pay | Admitting: Family Medicine

## 2022-04-11 ENCOUNTER — Ambulatory Visit (INDEPENDENT_AMBULATORY_CARE_PROVIDER_SITE_OTHER): Payer: 59 | Admitting: Family Medicine

## 2022-04-11 VITALS — BP 110/64 | HR 64 | Ht 66.0 in | Wt 153.4 lb

## 2022-04-11 DIAGNOSIS — R569 Unspecified convulsions: Secondary | ICD-10-CM

## 2022-04-11 MED ORDER — OXCARBAZEPINE 600 MG PO TABS: 600.0000 mg | ORAL_TABLET | Freq: Two times a day (BID) | ORAL | 3 refills | Status: DC

## 2022-04-11 MED ORDER — LEVETIRACETAM 1000 MG PO TABS: 1000.0000 mg | ORAL_TABLET | Freq: Two times a day (BID) | ORAL | 3 refills | Status: DC

## 2022-04-11 MED ORDER — DIVALPROEX SODIUM 500 MG PO DR TAB: 500.0000 mg | DELAYED_RELEASE_TABLET | Freq: Two times a day (BID) | ORAL | 3 refills | Status: DC

## 2022-04-11 NOTE — Patient Instructions (Signed)
Below is our plan:  We will continue divalproex '500mg'$ , levetiracetam '1000mg'$  and oxcarbazepine '600mg'$  twice daily  Please make sure you are consistent with timing of seizure medication. I recommend annual visit with primary care provider (PCP) for complete physical and routine blood work. I recommend daily intake of vitamin D (400-800iu) and calcium (800-'1000mg'$ ) for bone health. Discuss Dexa screening with PCP.   According to [place] law, you can not drive unless you are seizure / syncope free for at least 6 months and under physician's care.  Please maintain precautions. Do not participate in activities where a loss of awareness could harm you or someone else. No swimming alone, no tub bathing, no hot tubs, no driving, no operating motorized vehicles (cars, ATVs, motocycles, etc), lawnmowers, power tools or firearms. No standing at heights, such as rooftops, ladders or stairs. Avoid hot objects such as stoves, heaters, open fires. Wear a helmet when riding a bicycle, scooter, skateboard, etc. and avoid areas of traffic. Set your water heater to 120 degrees or less.  Please make sure you are staying well hydrated. I recommend 50-60 ounces daily. Well balanced diet and regular exercise encouraged. Consistent sleep schedule with 6-8 hours recommended.   Please continue follow up with care team as directed.   Follow up with me in 1 year   You may receive a survey regarding today's visit. I encourage you to leave honest feed back as I do use this information to improve patient care. Thank you for seeing me today!

## 2022-04-11 NOTE — Progress Notes (Signed)
PATIENT: Rebecca Gregory DOB: [birth year]  REASON FOR VISIT: follow up HISTORY FROM: patient  Chief Complaint  Patient presents with   Follow-up    RM 16. Last seen 04/10/21. Seizure f/u. No seizures since last visit, tolerating medications ok. Pt reports no new sx.     HISTORY OF PRESENT ILLNESS:  04/11/22 ALL: Rebecca Gregory returns for follow up for seizures. She continues divalproex '500mg'$  BID, levetiracetam '1000mg'$  BID and oxcarbazepine '600mg'$  BID. She presents with her dad who aids in history. She is doing well. No seizure activity since last visit. She has had 1-2 migraines over the past year but easily aborted with OTC meds. She is eating and sleeping well. She denies concerns, today. She was able to go to [place] this year and enjoyed her trip.   04/10/2021 ALL: Rebecca Gregory returns for follow up for seizures. She continues divalproex '500mg'$  BID, levetiracetam '1000mg'$  BID and oxcarbamazepine '600mg'$  twice daily. She is tolerating meds well. No missed doses. She did have one event where she was unresponsive and jaw was clenched. She was traveling with her mom and dad to [facility]. Event lasted a few minutes and then she went to sleep. She was back to baseline when she woke. No other events. No generalized seizures. She continues to work part time. She is feeling well, today.   04/07/2020 ALL:  She returns today for follow up. She continues divalproex '500mg'$  BID, levetiracetam '1000mg'$  BID and oxcarbamazepine '600mg'$  BID.  She is doing very well since last being seen.  No seizure activity.  She is tolerating medication well with no obvious adverse effects.  Tremor remains very mild.  Dad does note worsening when she is anxious.  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  She is fully vaccinated.  She is feeling well today and without concerns.  04/08/2019 ALL: Rebecca Gregory is a 52 y.o. female here today for follow up for seizure disorder. She continues divalproex '500mg'$  BID, levetiracetam  '1000mg'$  BID and oxcarbamazepine '600mg'$  twice daily. She is tolerating medications well. She has a very mild tremor of both hands that has remained stable. Mom reports she may have one small seizure each month with onset of menstrual cycle. She is not on birth control. She had a tubal ligation years ago. She has a great appetite. She stays well hydrated. She is working part time for a senior living facility. She lives with mom, Joseph Art, and dad, Jemes. She is able to dress and bathe herself. She does not drive. She can prepare meals in microwave but does not cook. She is feeling well today and without concerns.    HISTORY: (copied from [employer] note on 04/02/2018)  UPDATE 1/22/2020CM Ms. Zelenak, 52 year old female returns for follow-up with history of seizure disorder.  She remains on Depakote Keppra and Trileptal without side effects.  She continues to have a few small seizures during her menstrual cycle.  No generalized seizures.  No falls no balance issues.  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  She does not drive.  She returns for reevaluation no recent labs.   UPDATE 7/10/2019CM Ms. Henkes, 52 year old female returns for follow-up with a history of seizure disorder.  She continues to have small seizures with her menstrual cycles.  No grand mal seizures.  She remains on Depakote Keppra and Trileptal without side effects.  No new neurologic complaints.  Reviewed recent CBC and CMP from primary care 05/02/2017 WNL.  She returns for reevaluation   UPDATE (  03/20/17, VRP): Since last visit, doing well. Tolerating meds. No alleviating or aggravating factors. Small petit mal seizures continue prior to menstrual cycle. Some mild intermittent tremor.    PRIOR HPI (07/11/16): 52 year old female here for evaluation of seizure disorder. Patient has history of developmental delay. In 2002 she had first seizure of her life, with generalized convulsions, tonic-clonic, with incontinence. Patient went  to the hospital for evaluation. She had a second seizure within 1 month and was started on medication. Parents think she was started on Dilantin at that time. Over the years a chin transition to other antiseizure medications and currently takes oxcarbazepine, Depakote, levetiracetam. Since that time she is doing well. She has had one grand mal seizure in March 2018. Patient has had 5 total grand mal seizures in her life. She does have 1-2 "mini seizures" per month where she has staring, jittery sensation, abnormal mouth movements, makes a grunting noise or sound. These typically proceed her menstrual cycle.   Patient and her family used to live in [place] and recently moved to [place] in November 2017.   REVIEW OF SYSTEMS: Out of a complete 14 system review of symptoms, the patient complains only of the following symptoms, seizure, tremor and all other reviewed systems are negative.  ALLERGIES: No Known Allergies  HOME MEDICATIONS: Outpatient Medications Prior to Visit  Medication Sig Dispense Refill   b complex vitamins tablet Take 1 tablet by mouth daily.     fluticasone (FLONASE) 50 MCG/ACT nasal spray Place 1 spray into both nostrils daily.     Multiple Vitamins-Minerals (MULTIVITAMIN WITH MINERALS) tablet Take 1 tablet by mouth daily.     divalproex (DEPAKOTE) 500 MG DR tablet Take 1 tablet (500 mg total) by mouth 2 (two) times daily. 180 tablet 0   levETIRAcetam (KEPPRA) 1000 MG tablet Take 1 tablet (1,000 mg total) by mouth 2 (two) times daily. 180 tablet 0   oxcarbazepine (TRILEPTAL) 600 MG tablet Take 1 tablet (600 mg total) by mouth 2 (two) times daily. 180 tablet 0   No facility-administered medications prior to visit.    PAST MEDICAL HISTORY: Past Medical History:  Diagnosis Date   Developmental delay    Seizures ([place])    since 2002    PAST SURGICAL HISTORY: Past Surgical History:  Procedure Laterality Date   COLONOSCOPY WITH PROPOFOL N/A 10/02/2019   Procedure:  COLONOSCOPY WITH PROPOFOL;  Surgeon: Carol Ada, MD;  Location: [facility];  Service: Endoscopy;  Laterality: N/A;   POLYPECTOMY  10/02/2019   Procedure: POLYPECTOMY;  Surgeon: Carol Ada, MD;  Location: [facility];  Service: Endoscopy;;    FAMILY HISTORY: Family History  Problem Relation Age of Onset   Breast cancer Neg Hx     SOCIAL HISTORY: Social History   Socioeconomic History   Marital status: Single    Spouse name: Not on file   Number of children: 0   Years of education: 12   Highest education level: Not on file  Occupational History    Comment: NA  Tobacco Use   Smoking status: Never   Smokeless tobacco: Never  Substance and Sexual Activity   Alcohol use: No   Drug use: No   Sexual activity: Not Currently  Other Topics Concern   Not on file  Social History Narrative   Lives with parents   No caffeine   Social Determinants of Health   Financial Resource Strain: Not on file  Food Insecurity: Not on file  Transportation Needs: Not on file  Physical Activity: Not on file  Stress: Not on file  Social Connections: Not on file  Intimate Partner Violence: Not on file      PHYSICAL EXAM  Vitals:   04/11/22 1310  BP: 110/64  Pulse: 64  Weight: 153 lb 6.4 oz (69.6 kg)  Height: '5\' 6"'$  (1.676 m)    Body mass index is 24.76 kg/m.  Generalized: Well developed, in no acute distress  Cardiology: normal rate and rhythm, no murmur noted Respiratory: clear to auscultation bilaterally  Neurological examination  Mentation: Alert oriented to time, place, history taking. Follows all commands speech and language fluent Cranial nerve II-XII: Pupils were equal round reactive to light. Extraocular movements were full, visual field were full on confrontational test. Facial sensation and strength were normal. Head turning and shoulder shrug  were normal and symmetric. Motor: The motor testing reveals 5 over 5 strength of all 4 extremities. Good symmetric motor  tone is noted throughout. Mild tremor of left thumb  Sensory: Sensory testing is intact to soft touch on all 4 extremities. No evidence of extinction is noted.  Gait and station: Gait is normal.   DIAGNOSTIC DATA (LABS, IMAGING, TESTING) - I reviewed patient records, labs, notes, testing and imaging myself where available.      No data to display           Lab Results  Component Value Date   WBC 4.4 04/07/2020   HGB 12.8 04/07/2020   HCT 36.4 04/07/2020   MCV 85 04/07/2020   PLT 262 04/07/2020      Component Value Date/Time   NA 142 04/07/2020 1344   K 4.5 04/07/2020 1344   CL 108 (H) 04/07/2020 1344   CO2 21 04/07/2020 1344   GLUCOSE 84 04/07/2020 1344   BUN 18 04/07/2020 1344   CREATININE 0.58 04/07/2020 1344   CALCIUM 9.5 04/07/2020 1344   PROT 6.7 04/07/2020 1344   ALBUMIN 4.4 04/07/2020 1344   AST 12 04/07/2020 1344   ALT 11 04/07/2020 1344   ALKPHOS 38 (L) 04/07/2020 1344   BILITOT <0.2 04/07/2020 1344   GFRNONAA 110 04/07/2020 1344   GFRAA 127 04/07/2020 1344   No results found for: "CHOL", "HDL", "LDLCALC", "LDLDIRECT", "TRIG", "CHOLHDL" No results found for: "HGBA1C" No results found for: "VITAMINB12" No results found for: "TSH"     ASSESSMENT AND PLAN 52 y.o. year old female  has a past medical history of Developmental delay and Seizures ([place]). here with     ICD-10-CM   1. Seizures ([place])  R56.9 Valproic Acid Level    10-Hydroxycarbazepine    CBC with Differential/Platelets    CMP    Vitamin D, 25-hydroxy      Corlette is doing very well. We will continue divalproex '500mg'$  BID, levetiracetam '1000mg'$  BID and oxcarbamazepine '600mg'$  twice daily. I will update labs today. Refills have been sent to pharmacy. We will continue to monitor closely. Adequate hydration and seizure precautions discussed. She does not drive. She will follow up in 1 year, sooner if needed. She and dad verbalize understanding and agreement with this plan.    Orders Placed This  Encounter  Procedures   Valproic Acid Level   10-Hydroxycarbazepine   CBC with Differential/Platelets   CMP   Vitamin D, 25-hydroxy     Meds ordered this encounter  Medications   divalproex (DEPAKOTE) 500 MG DR tablet    Sig: Take 1 tablet (500 mg total) by mouth 2 (two) times daily.    Dispense:  180 tablet    Refill:  3    Order Specific Question:   Supervising Provider    Answer:   Melvenia Beam [[id number]]   levETIRAcetam (KEPPRA) 1000 MG tablet    Sig: Take 1 tablet (1,000 mg total) by mouth 2 (two) times daily.    Dispense:  180 tablet    Refill:  3    Order Specific Question:   Supervising Provider    Answer:   Melvenia Beam [[id number]]   oxcarbazepine (TRILEPTAL) 600 MG tablet    Sig: Take 1 tablet (600 mg total) by mouth 2 (two) times daily.    Dispense:  180 tablet    Refill:  3    Order Specific Question:   Supervising Provider    Answer:   Melvenia Beam [[id number]]      POI PPGFQ, FNP-C 04/11/2022, 1:33 PM [facility] [address], [place], [place] [address] [phone]

## 2022-04-17 LAB — CBC WITH DIFFERENTIAL/PLATELET
Basophils Absolute: 0 10*3/uL (ref 0.0–0.2)
Basos: 1 %
EOS (ABSOLUTE): 0.2 10*3/uL (ref 0.0–0.4)
Eos: 4 %
Hematocrit: 37.4 % (ref 34.0–46.6)
Hemoglobin: 13.1 g/dL (ref 11.1–15.9)
Immature Grans (Abs): 0 10*3/uL (ref 0.0–0.1)
Immature Granulocytes: 0 %
Lymphocytes Absolute: 1.4 10*3/uL (ref 0.7–3.1)
Lymphs: 32 %
MCH: 28.7 pg (ref 26.6–33.0)
MCHC: 35 g/dL (ref 31.5–35.7)
MCV: 82 fL (ref 79–97)
Monocytes Absolute: 0.5 10*3/uL (ref 0.1–0.9)
Monocytes: 12 %
Neutrophils Absolute: 2.2 10*3/uL (ref 1.4–7.0)
Neutrophils: 51 %
Platelets: 238 10*3/uL (ref 150–450)
RBC: 4.56 x10E6/uL (ref 3.77–5.28)
RDW: 12.9 % (ref 11.7–15.4)
WBC: 4.3 10*3/uL (ref 3.4–10.8)

## 2022-04-17 LAB — COMPREHENSIVE METABOLIC PANEL
ALT: 9 IU/L (ref 0–32)
AST: 13 IU/L (ref 0–40)
Albumin/Globulin Ratio: 2 (ref 1.2–2.2)
Albumin: 4.6 g/dL (ref 3.9–4.9)
Alkaline Phosphatase: 54 IU/L (ref 44–121)
BUN/Creatinine Ratio: 25 — ABNORMAL HIGH (ref 9–23)
BUN: 15 mg/dL (ref 6–24)
Bilirubin Total: 0.2 mg/dL (ref 0.0–1.2)
CO2: 23 mmol/L (ref 20–29)
Calcium: 9.7 mg/dL (ref 8.7–10.2)
Chloride: 103 mmol/L (ref 96–106)
Creatinine, Ser: 0.6 mg/dL (ref 0.57–1.00)
Globulin, Total: 2.3 g/dL (ref 1.5–4.5)
Glucose: 83 mg/dL (ref 70–99)
Potassium: 4.4 mmol/L (ref 3.5–5.2)
Sodium: 141 mmol/L (ref 134–144)
Total Protein: 6.9 g/dL (ref 6.0–8.5)
eGFR: 110 mL/min/{1.73_m2} (ref >59)

## 2022-04-17 LAB — 10-HYDROXYCARBAZEPINE: Oxcarbazepine SerPl-Mcnc: 14 ug/mL (ref 10–35)

## 2022-04-17 LAB — VITAMIN D 25 HYDROXY (VIT D DEFICIENCY, FRACTURES): Vit D, 25-Hydroxy: 32.6 ng/mL (ref 30.0–100.0)

## 2022-04-17 LAB — VALPROIC ACID LEVEL: Valproic Acid Lvl: 52 ug/mL (ref 50–100)

## 2022-08-23 ENCOUNTER — Other Ambulatory Visit: Payer: Self-pay

## 2022-08-23 MED ORDER — DIVALPROEX SODIUM 500 MG PO DR TAB: 500.0000 mg | DELAYED_RELEASE_TABLET | Freq: Two times a day (BID) | ORAL | 3 refills | Status: DC

## 2022-09-21 ENCOUNTER — Other Ambulatory Visit: Payer: Self-pay | Admitting: Internal Medicine

## 2022-09-21 DIAGNOSIS — Z1231 Encounter for screening mammogram for malignant neoplasm of breast: Secondary | ICD-10-CM

## 2022-10-30 ENCOUNTER — Ambulatory Visit: Payer: 59

## 2022-11-20 ENCOUNTER — Ambulatory Visit
Admission: RE | Admit: 2022-11-20 | Discharge: 2022-11-20 | Disposition: A | Discharge status: Home / Self Care | Payer: 59 | Source: Ambulatory Visit | Attending: Internal Medicine | Admitting: Internal Medicine

## 2022-11-20 DIAGNOSIS — Z1231 Encounter for screening mammogram for malignant neoplasm of breast: Secondary | ICD-10-CM

## 2022-12-26 ENCOUNTER — Other Ambulatory Visit: Payer: Self-pay | Admitting: Internal Medicine

## 2022-12-26 DIAGNOSIS — R634 Abnormal weight loss: Secondary | ICD-10-CM

## 2022-12-26 DIAGNOSIS — R1032 Left lower quadrant pain: Secondary | ICD-10-CM

## 2022-12-31 ENCOUNTER — Ambulatory Visit
Admission: RE | Admit: 2022-12-31 | Discharge: 2022-12-31 | Disposition: A | Discharge status: Home / Self Care | Payer: 59 | Source: Ambulatory Visit | Attending: Internal Medicine | Admitting: Internal Medicine

## 2022-12-31 DIAGNOSIS — R634 Abnormal weight loss: Secondary | ICD-10-CM

## 2022-12-31 DIAGNOSIS — R1032 Left lower quadrant pain: Secondary | ICD-10-CM

## 2022-12-31 MED ORDER — IOPAMIDOL (ISOVUE-300) INJECTION 61%
100.0000 mL | Freq: Once | INTRAVENOUS | Status: AC | PRN
  Administered 2022-12-31: 100 mL via INTRAVENOUS

## 2023-01-07 ENCOUNTER — Other Ambulatory Visit: Payer: Self-pay

## 2023-01-07 ENCOUNTER — Telehealth: Payer: Self-pay | Admitting: Family Medicine

## 2023-01-07 NOTE — Telephone Encounter (Signed)
Last seen 04/11/22 and next f/u 04/10/23.   Last refill sent 04/11/22 #180, 3 refills ( 1 yr supply) to [id number] - [place], [place] - [address].   Pharmacy should have refills on file.

## 2023-01-07 NOTE — Telephone Encounter (Signed)
Called and spoke to mother and she appreciated the call. She does not understand why the pharmacy informed her that the medication is not authorized.

## 2023-01-07 NOTE — Telephone Encounter (Signed)
Called [facility] at  [phone]. Spoke w/ Luisa Hart. Confirmed they have rx on file and processed refill. Nothing further needed.

## 2023-01-07 NOTE — Telephone Encounter (Signed)
Pt's mother request refill for oxcarbazepine (TRILEPTAL) 600 MG tablet sent to [facility] #[id number]

## 2023-01-31 IMAGING — MG MM DIGITAL SCREENING BILAT W/ TOMO AND CAD
8 series · 9 of 24 positions shown · non-contrast
Comparison: Previous exam(s).

CLINICAL DATA: Screening.

EXAM:
DIGITAL SCREENING BILATERAL MAMMOGRAM WITH TOMOSYNTHESIS AND CAD
TECHNIQUE: Bilateral screening digital craniocaudal and mediolateral oblique
mammograms were obtained. Bilateral screening digital breast
tomosynthesis was performed. The images were evaluated with
computer-aided detection.

[L MLO synth-2D]
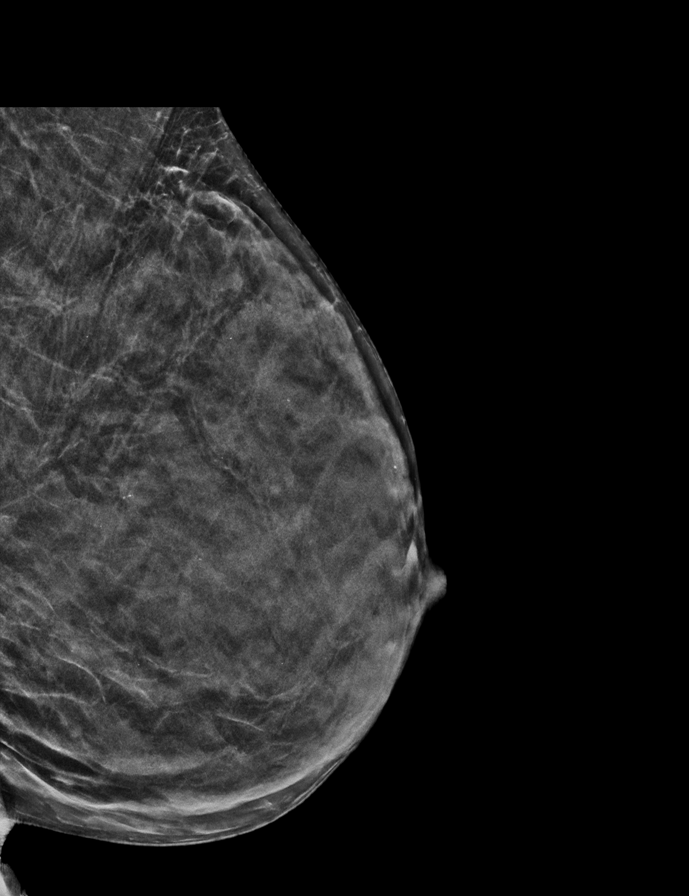

[L CC synth-2D]
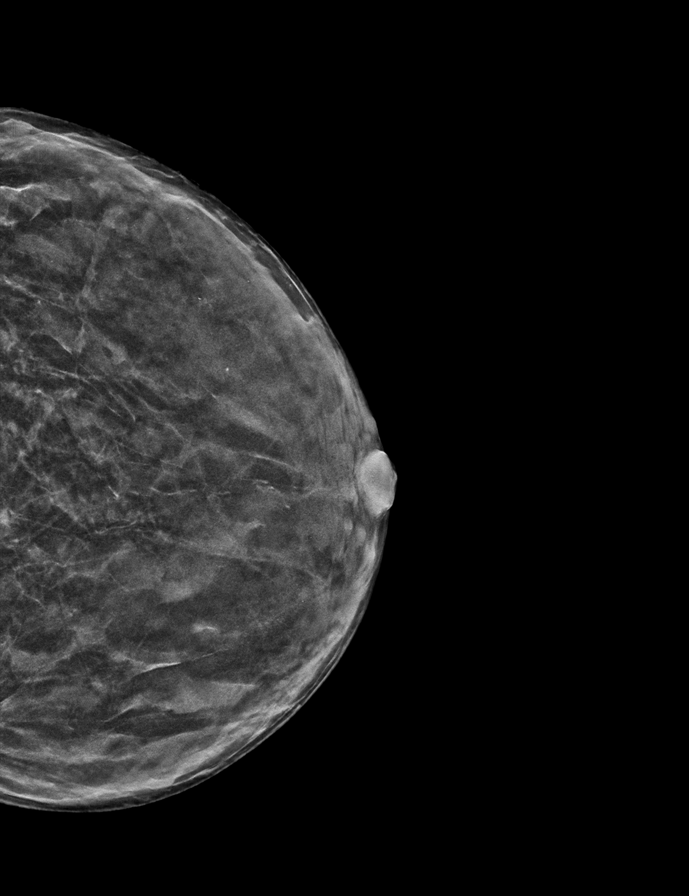

[R MLO synth-2D]
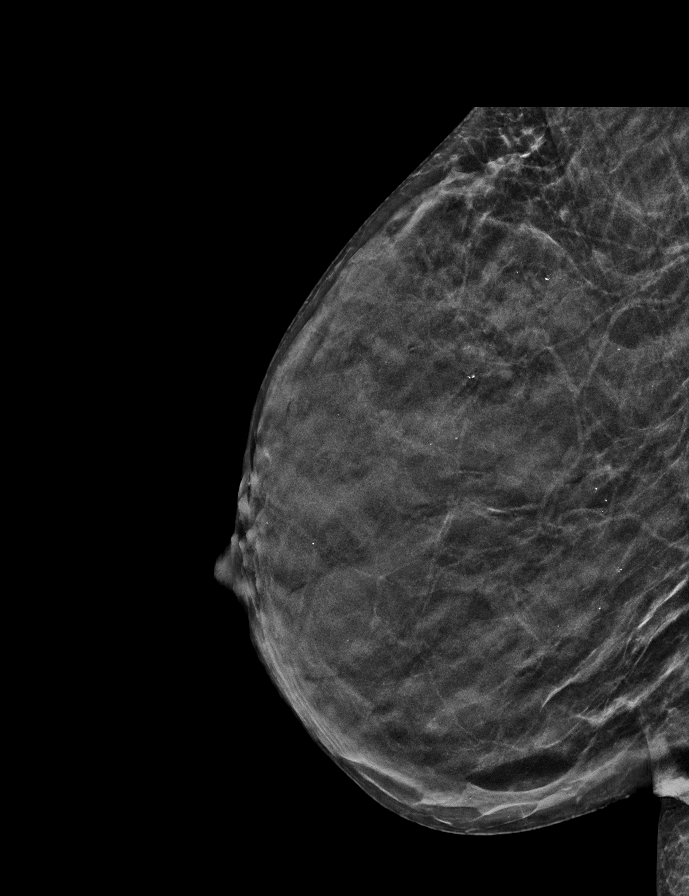

[R CC synth-2D]
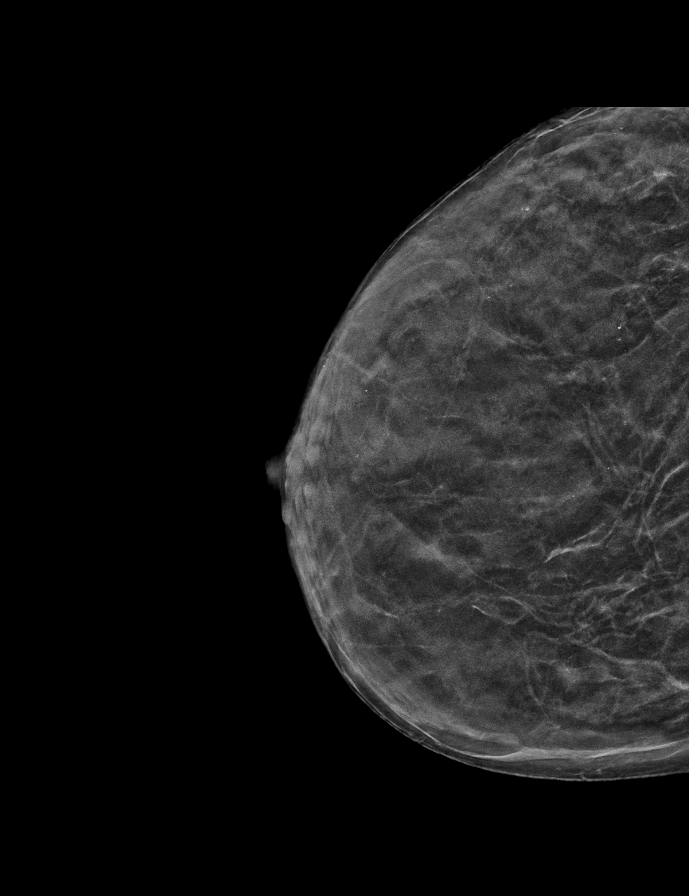

[R MLO tomo · 2 of 48 frames shown]
[frame 16/48]
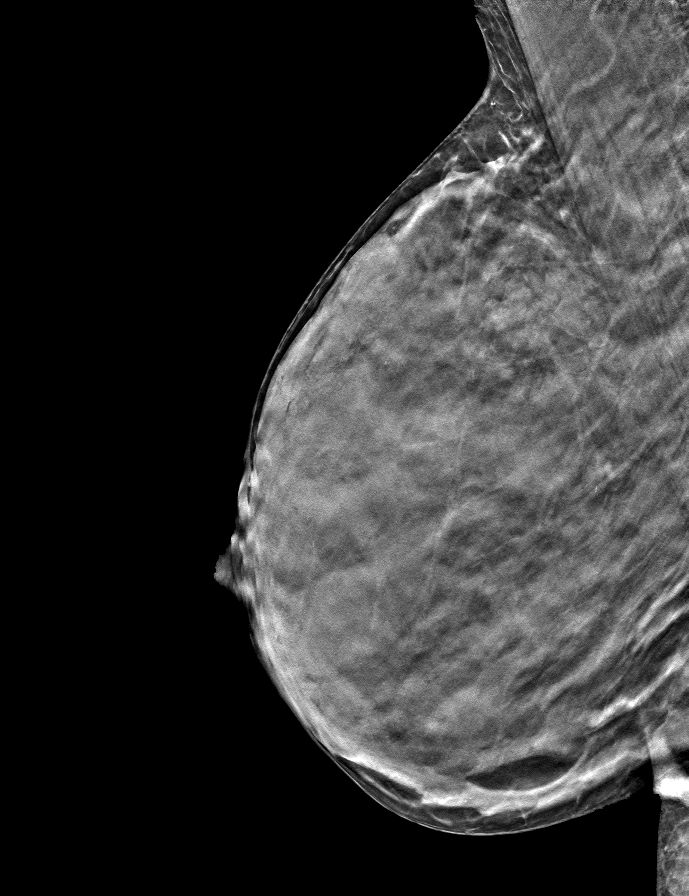
[frame 25/48]
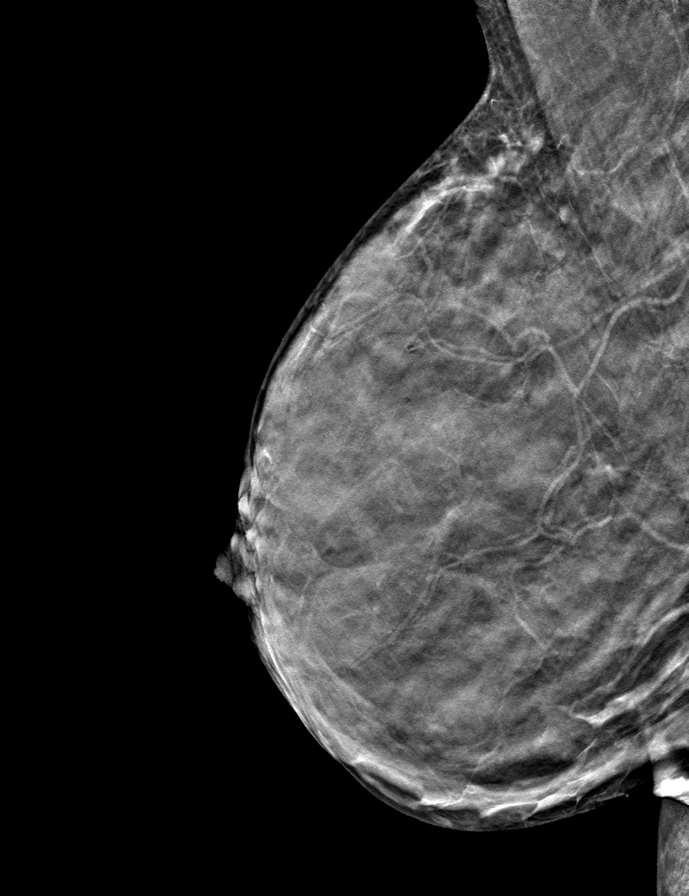

[R CC tomo · tomo slice 26/51.0]
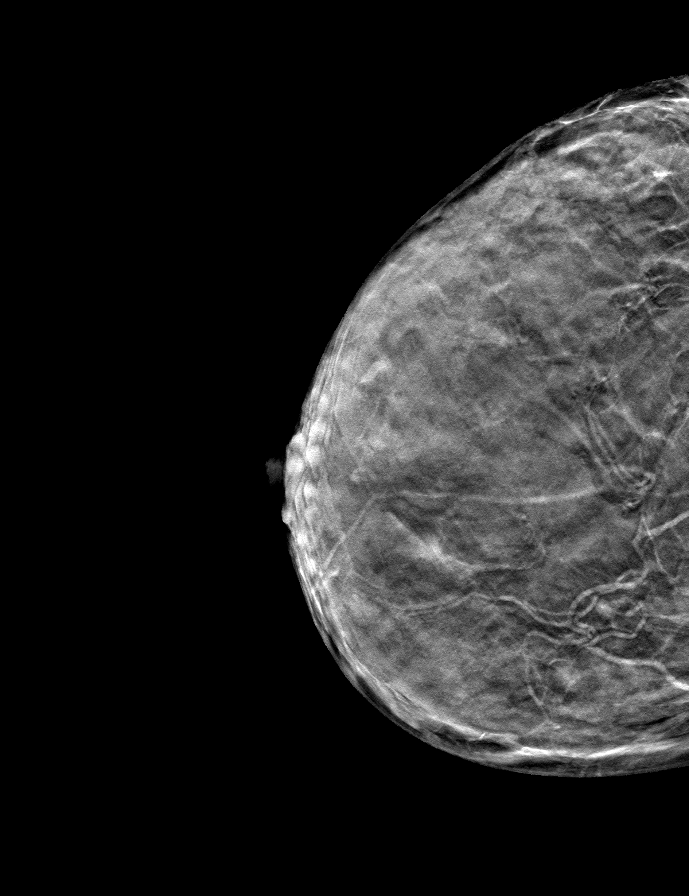

[L MLO tomo · tomo slice 23/46.0]
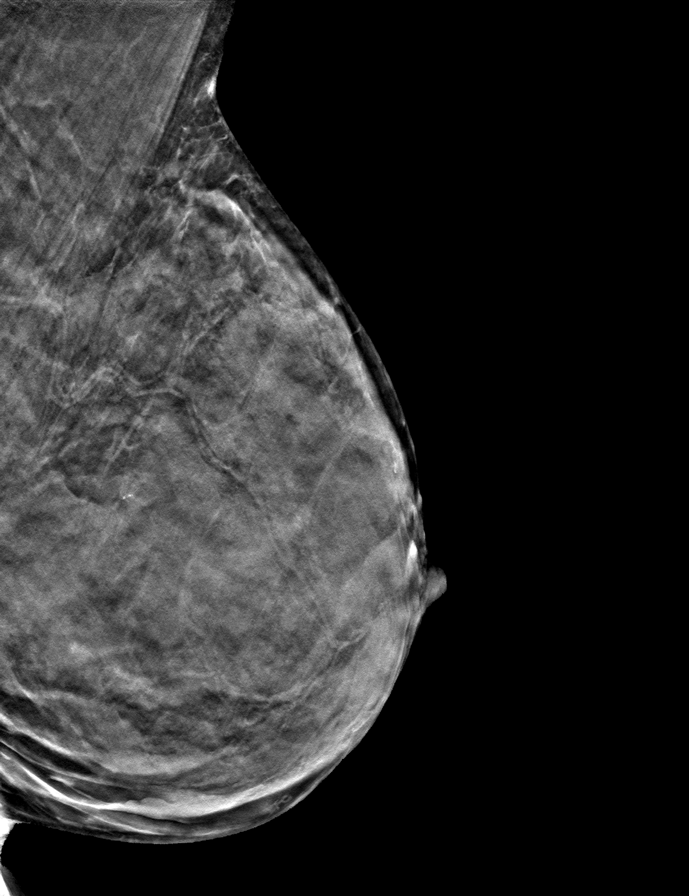

[L CC tomo · tomo slice 25/50.0]
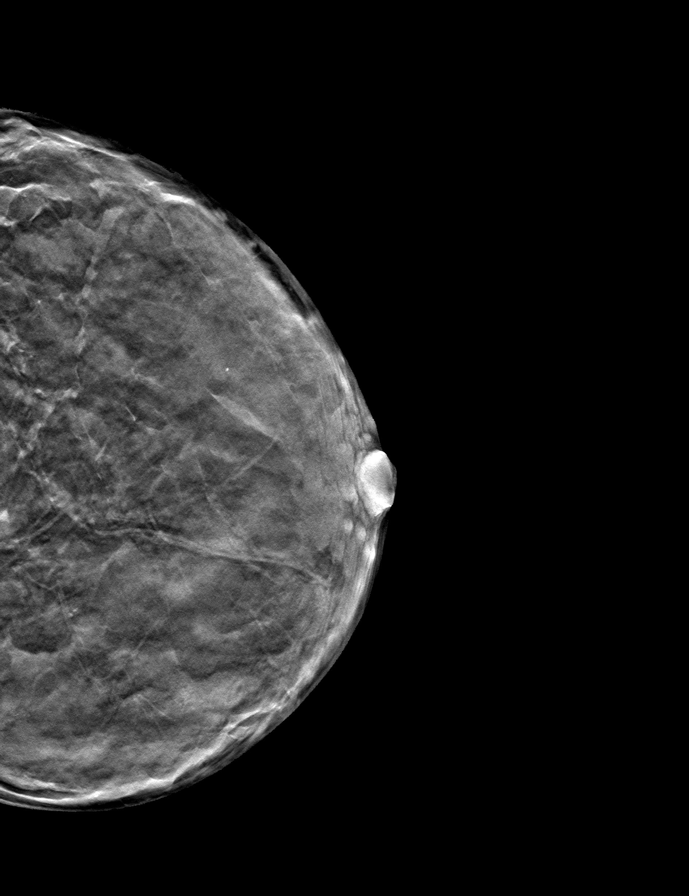

[9 of 24 positions shown; findings below may reference images not displayed]

ACR Breast Density Category d: The breast tissue is extremely dense,
which lowers the sensitivity of mammography
FINDINGS: There are no findings suspicious for malignancy.
IMPRESSION: No mammographic evidence of malignancy. A result letter of this
screening mammogram will be mailed directly to the patient.

RECOMMENDATION:
Screening mammogram in one year. (Code:[id number])

BI-RADS CATEGORY  1: Negative.

## 2023-04-10 ENCOUNTER — Encounter: Payer: Self-pay | Admitting: Family Medicine

## 2023-04-10 ENCOUNTER — Ambulatory Visit (INDEPENDENT_AMBULATORY_CARE_PROVIDER_SITE_OTHER): Payer: 59 | Admitting: Family Medicine

## 2023-04-10 VITALS — BP 117/66 | HR 63 | Ht 65.0 in | Wt 127.0 lb

## 2023-04-10 DIAGNOSIS — R569 Unspecified convulsions: Secondary | ICD-10-CM | POA: Diagnosis not present

## 2023-04-10 MED ORDER — DIVALPROEX SODIUM 500 MG PO DR TAB: 500.0000 mg | DELAYED_RELEASE_TABLET | Freq: Two times a day (BID) | ORAL | 3 refills | Status: AC

## 2023-04-10 MED ORDER — LEVETIRACETAM 1000 MG PO TABS: 1000.0000 mg | ORAL_TABLET | Freq: Two times a day (BID) | ORAL | 3 refills | Status: DC

## 2023-04-10 MED ORDER — OXCARBAZEPINE 600 MG PO TABS: 600.0000 mg | ORAL_TABLET | Freq: Two times a day (BID) | ORAL | 3 refills | Status: AC

## 2023-04-10 NOTE — Patient Instructions (Signed)
Below is our plan:  We will continue divalproex 500mg , levetiracetam 1000mg  and oxcarbazepine 600mg  twice daily.  Please make sure you are consistent with timing of seizure medication. I recommend annual visit with primary care provider (PCP) for complete physical and routine blood work. I recommend daily intake of vitamin D (400-800iu) and calcium (800-1000mg ) for bone health. Discuss Dexa screening with PCP.   According to [place] law, you can not drive unless you are seizure / syncope free for at least 6 months and under physician's care.  Please maintain precautions. Do not participate in activities where a loss of awareness could harm you or someone else. No swimming alone, no tub bathing, no hot tubs, no driving, no operating motorized vehicles (cars, ATVs, motocycles, etc), lawnmowers, power tools or firearms. No standing at heights, such as rooftops, ladders or stairs. Avoid hot objects such as stoves, heaters, open fires. Wear a helmet when riding a bicycle, scooter, skateboard, etc. and avoid areas of traffic. Set your water heater to 120 degrees or less.  SUDEP is the sudden, unexpected death of someone with epilepsy, who was otherwise healthy. In SUDEP cases, no other cause of death is found when an autopsy is done. Each year, more than 1 in 1,000 people with epilepsy die from SUDEP. This is the leading cause of death in people with uncontrolled seizures. Until further answers are available, the best way to prevent SUDEP is to lower your risk by controlling seizures. Research has found that people with all types of epilepsy that experience convulsive seizures can be at risk.  Please make sure you are staying well hydrated. I recommend 50-60 ounces daily. Well balanced diet and regular exercise encouraged. Consistent sleep schedule with 6-8 hours recommended.   Please continue follow up with care team as directed.   Follow up with me in 1 year  You may receive a survey regarding today's  visit. I encourage you to leave honest feed back as I do use this information to improve patient care. Thank you for seeing me today!

## 2023-04-10 NOTE — Progress Notes (Signed)
PATIENT: Rebecca Gregory DOB: 01/24/1971  REASON FOR VISIT: follow up HISTORY FROM: patient  Chief Complaint  Patient presents with   Room 1    Pt is here with her Mother.  Pt's mother states that she has been doing great since her last appointment.      HISTORY OF PRESENT ILLNESS:  04/10/23 ALL: Rebecca Gregory returns for follow up for seizures. She continues divalproex 500mg  BID, levetiracetam 1000mg  BID and oxcarbazepine 600mg  BID. She presents with her mother who aids in history. She reports tolerating meds well. No obvious adverse effects. No seizures. She is following closely with PCP. She has lost about 20lbs over the past year. No obvious cause. No changes in appetite. Workup has been unremarkable and weight seems to be stable. She is staying well hydrated and sleeps well.   04/11/2022 ALL:  Rebecca Gregory returns for follow up for seizures. She continues divalproex 500mg  BID, levetiracetam 1000mg  BID and oxcarbazepine 600mg  BID. She presents with her dad who aids in history. She is doing well. No seizure activity since last visit. She has had 1-2 migraines over the past year but easily aborted with OTC meds. She is eating and sleeping well. She denies concerns, today. She was able to go to [place] this year and enjoyed her trip.   04/10/2021 ALL: Rebecca Gregory returns for follow up for seizures. She continues divalproex 500mg  BID, levetiracetam 1000mg  BID and oxcarbamazepine 600mg  twice daily. She is tolerating meds well. No missed doses. She did have one event where she was unresponsive and jaw was clenched. She was traveling with her mom and dad to [facility]. Event lasted a few minutes and then she went to sleep. She was back to baseline when she woke. No other events. No generalized seizures. She continues to work part time. She is feeling well, today.   04/07/2020 ALL:  She returns today for follow up. She continues divalproex 500mg  BID, levetiracetam 1000mg  BID and oxcarbamazepine 600mg  BID.  She  is doing very well since last being seen.  No seizure activity.  She is tolerating medication well with no obvious adverse effects.  Tremor remains very mild.  Dad does note worsening when she is anxious.  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  She is fully vaccinated.  She is feeling well today and without concerns.  04/08/2019 ALL: Rebecca Gregory is a 53 y.o. female here today for follow up for seizure disorder. She continues divalproex 500mg  BID, levetiracetam 1000mg  BID and oxcarbamazepine 600mg  twice daily. She is tolerating medications well. She has a very mild tremor of both hands that has remained stable. Mom reports she may have one small seizure each month with onset of menstrual cycle. She is not on birth control. She had a tubal ligation years ago. She has a great appetite. She stays well hydrated. She is working part time for a senior living facility. She lives with mom, Luster Landsberg, and dad, Jemes. She is able to dress and bathe herself. She does not drive. She can prepare meals in microwave but does not cook. She is feeling well today and without concerns.    HISTORY: (copied from [employer] note on 04/02/2018)  UPDATE 1/22/2020CM Ms. Rebecca Gregory, 53 year old female returns for follow-up with history of seizure disorder.  She remains on Depakote Keppra and Trileptal without side effects.  She continues to have a few small seizures during her menstrual cycle.  No generalized seizures.  No falls no balance issues.  She continues to  work doing Barrister's clerk.  She does not drive.  She returns for reevaluation no recent labs.   UPDATE 7/10/2019CM Ms. Rebecca Gregory, 53 year old female returns for follow-up with a history of seizure disorder.  She continues to have small seizures with her menstrual cycles.  No grand mal seizures.  She remains on Depakote Keppra and Trileptal without side effects.  No new neurologic complaints.  Reviewed recent CBC and CMP from primary care  05/02/2017 WNL.  She returns for reevaluation   UPDATE (03/20/17, VRP): Since last visit, doing well. Tolerating meds. No alleviating or aggravating factors. Small petit mal seizures continue prior to menstrual cycle. Some mild intermittent tremor.    PRIOR HPI (07/11/16): 53 year old female here for evaluation of seizure disorder. Patient has history of developmental delay. In 2002 she had first seizure of her life, with generalized convulsions, tonic-clonic, with incontinence. Patient went to the hospital for evaluation. She had a second seizure within 1 month and was started on medication. Parents think she was started on Dilantin at that time. Over the years a chin transition to other antiseizure medications and currently takes oxcarbazepine, Depakote, levetiracetam. Since that time she is doing well. She has had one grand mal seizure in March 2018. Patient has had 5 total grand mal seizures in her life. She does have 1-2 "mini seizures" per month where she has staring, jittery sensation, abnormal mouth movements, makes a grunting noise or sound. These typically proceed her menstrual cycle.   Patient and her family used to live in [place] and recently moved to [place] in November 2017.   REVIEW OF SYSTEMS: Out of a complete 14 system review of symptoms, the patient complains only of the following symptoms, seizure, tremor and all other reviewed systems are negative.  ALLERGIES: No Known Allergies  HOME MEDICATIONS: Outpatient Medications Prior to Visit  Medication Sig Dispense Refill   b complex vitamins tablet Take 1 tablet by mouth daily.     fluticasone (FLONASE) 50 MCG/ACT nasal spray Place 1 spray into both nostrils daily.     Multiple Vitamins-Minerals (MULTIVITAMIN WITH MINERALS) tablet Take 1 tablet by mouth daily.     divalproex (DEPAKOTE) 500 MG DR tablet Take 1 tablet (500 mg total) by mouth 2 (two) times daily. 180 tablet 3   levETIRAcetam (KEPPRA) 1000 MG tablet Take 1 tablet  (1,000 mg total) by mouth 2 (two) times daily. 180 tablet 3   oxcarbazepine (TRILEPTAL) 600 MG tablet Take 1 tablet (600 mg total) by mouth 2 (two) times daily. 180 tablet 3   No facility-administered medications prior to visit.    PAST MEDICAL HISTORY: Past Medical History:  Diagnosis Date   Developmental delay    Seizures (HCC)    since 2002    PAST SURGICAL HISTORY: Past Surgical History:  Procedure Laterality Date   COLONOSCOPY WITH PROPOFOL N/A 10/02/2019   Procedure: COLONOSCOPY WITH PROPOFOL;  Surgeon: Jeani Hawking, MD;  Location: [facility];  Service: Endoscopy;  Laterality: N/A;   POLYPECTOMY  10/02/2019   Procedure: POLYPECTOMY;  Surgeon: Jeani Hawking, MD;  Location: [facility];  Service: Endoscopy;;    FAMILY HISTORY: Family History  Problem Relation Age of Onset   Breast cancer Neg Hx     SOCIAL HISTORY: Social History   Socioeconomic History   Marital status: Single    Spouse name: Not on file   Number of children: 0   Years of education: 12   Highest education level: Not on file  Occupational History  Comment: NA  Tobacco Use   Smoking status: Never   Smokeless tobacco: Never  Substance and Sexual Activity   Alcohol use: No   Drug use: No   Sexual activity: Not Currently  Other Topics Concern   Not on file  Social History Narrative   Lives with parents   No caffeine   Social Drivers of Corporate investment banker Strain: Not on file  Food Insecurity: Not on file  Transportation Needs: Not on file  Physical Activity: Not on file  Stress: Not on file  Social Connections: Not on file  Intimate Partner Violence: Not on file      PHYSICAL EXAM  Vitals:   04/10/23 1251  BP: 117/66  Pulse: 63  Weight: 127 lb (57.6 kg)  Height: 5\' 5"  (1.651 m)     Body mass index is 21.13 kg/m.  Generalized: Well developed, in no acute distress  Cardiology: normal rate and rhythm, no murmur noted Respiratory: clear to auscultation  bilaterally  Neurological examination  Mentation: Alert oriented to time, place, and most history taking. Follows all commands speech and language fluent Cranial nerve II-XII: Pupils were equal round reactive to light. Extraocular movements were full, visual field were full on confrontational test. Facial sensation and strength were normal. Head turning and shoulder shrug  were normal and symmetric. Motor: The motor testing reveals 5 over 5 strength of all 4 extremities. Good symmetric motor tone is noted throughout. Mild tremor of left thumb  Sensory: Sensory testing is intact to soft touch on all 4 extremities. No evidence of extinction is noted.  Gait and station: Gait is normal.   DIAGNOSTIC DATA (LABS, IMAGING, TESTING) - I reviewed patient records, labs, notes, testing and imaging myself where available.      No data to display           Lab Results  Component Value Date   WBC 4.3 04/11/2022   HGB 13.1 04/11/2022   HCT 37.4 04/11/2022   MCV 82 04/11/2022   PLT 238 04/11/2022      Component Value Date/Time   NA 141 04/11/2022 1339   K 4.4 04/11/2022 1339   CL 103 04/11/2022 1339   CO2 23 04/11/2022 1339   GLUCOSE 83 04/11/2022 1339   BUN 15 04/11/2022 1339   CREATININE 0.60 04/11/2022 1339   CALCIUM 9.7 04/11/2022 1339   PROT 6.9 04/11/2022 1339   ALBUMIN 4.6 04/11/2022 1339   AST 13 04/11/2022 1339   ALT 9 04/11/2022 1339   ALKPHOS 54 04/11/2022 1339   BILITOT 0.2 04/11/2022 1339   GFRNONAA 110 04/07/2020 1344   GFRAA 127 04/07/2020 1344   No results found for: "CHOL", "HDL", "LDLCALC", "LDLDIRECT", "TRIG", "CHOLHDL" No results found for: "HGBA1C" No results found for: "VITAMINB12" No results found for: "TSH"     ASSESSMENT AND PLAN 53 y.o. year old female  has a past medical history of Developmental delay and Seizures (HCC). here with     ICD-10-CM   1. Seizures (HCC)  R56.9 Valproic Acid Level    10-Hydroxycarbazepine    CMP    CBC with  Differential/Platelets      Montoya is doing very well. We will continue divalproex 500mg  BID, levetiracetam 1000mg  BID and oxcarbamazepine 600mg  twice daily. I will update labs today. Refills have been sent to pharmacy. We will continue to monitor closely. Adequate hydration and seizure precautions discussed. She does not drive. She will follow up in 1 year, sooner if needed. She  and mom verbalize understanding and agreement with this plan.    Orders Placed This Encounter  Procedures   Valproic Acid Level   10-Hydroxycarbazepine   CMP   CBC with Differential/Platelets     Meds ordered this encounter  Medications   divalproex (DEPAKOTE) 500 MG DR tablet    Sig: Take 1 tablet (500 mg total) by mouth 2 (two) times daily.    Dispense:  180 tablet    Refill:  3    Supervising Provider:   Anson Fret [[id number]]   levETIRAcetam (KEPPRA) 1000 MG tablet    Sig: Take 1 tablet (1,000 mg total) by mouth 2 (two) times daily.    Dispense:  180 tablet    Refill:  3    Supervising Provider:   Anson Fret [[id number]]   oxcarbazepine (TRILEPTAL) 600 MG tablet    Sig: Take 1 tablet (600 mg total) by mouth 2 (two) times daily.    Dispense:  180 tablet    Refill:  3    Supervising Provider:   Anson Fret [id number]     I spent 30 minutes of face-to-face and non-face-to-face time with patient.  This included previsit chart review, lab review, study review, order entry, electronic health record documentation, patient education.   Shawnie Dapper, FNP-C 04/10/2023, 1:24 PM [facility] [address] [place], [place] [address] [phone]

## 2023-04-15 ENCOUNTER — Encounter: Payer: Self-pay | Admitting: Family Medicine

## 2023-04-15 LAB — COMPREHENSIVE METABOLIC PANEL
ALT: 10 [IU]/L (ref 0–32)
AST: 15 [IU]/L (ref 0–40)
Albumin: 4.4 g/dL (ref 3.9–4.9)
Alkaline Phosphatase: 52 [IU]/L (ref 44–121)
BUN/Creatinine Ratio: 20 (ref 9–23)
BUN: 10 mg/dL (ref 6–24)
Bilirubin Total: 0.3 mg/dL (ref 0.0–1.2)
CO2: 26 mmol/L (ref 20–29)
Calcium: 9.7 mg/dL (ref 8.7–10.2)
Chloride: 103 mmol/L (ref 96–106)
Creatinine, Ser: 0.51 mg/dL — ABNORMAL LOW (ref 0.57–1.00)
Globulin, Total: 2.2 g/dL (ref 1.5–4.5)
Glucose: 68 mg/dL — ABNORMAL LOW (ref 70–99)
Potassium: 4.3 mmol/L (ref 3.5–5.2)
Sodium: 142 mmol/L (ref 134–144)
Total Protein: 6.6 g/dL (ref 6.0–8.5)
eGFR: 114 mL/min/{1.73_m2} (ref >59)

## 2023-04-15 LAB — CBC WITH DIFFERENTIAL/PLATELET
Basophils Absolute: 0 10*3/uL (ref 0.0–0.2)
Basos: 1 %
EOS (ABSOLUTE): 0.2 10*3/uL (ref 0.0–0.4)
Eos: 6 %
Hematocrit: 38.1 % (ref 34.0–46.6)
Hemoglobin: 12.9 g/dL (ref 11.1–15.9)
Immature Grans (Abs): 0 10*3/uL (ref 0.0–0.1)
Immature Granulocytes: 0 %
Lymphocytes Absolute: 1.1 10*3/uL (ref 0.7–3.1)
Lymphs: 36 %
MCH: 29.2 pg (ref 26.6–33.0)
MCHC: 33.9 g/dL (ref 31.5–35.7)
MCV: 86 fL (ref 79–97)
Monocytes Absolute: 0.4 10*3/uL (ref 0.1–0.9)
Monocytes: 12 %
Neutrophils Absolute: 1.4 10*3/uL (ref 1.4–7.0)
Neutrophils: 45 %
Platelets: 222 10*3/uL (ref 150–450)
RBC: 4.42 x10E6/uL (ref 3.77–5.28)
RDW: 12.7 % (ref 11.7–15.4)
WBC: 3 10*3/uL — ABNORMAL LOW (ref 3.4–10.8)

## 2023-04-15 LAB — 10-HYDROXYCARBAZEPINE: Oxcarbazepine SerPl-Mcnc: 19 ug/mL (ref 10–35)

## 2023-04-15 LAB — VALPROIC ACID LEVEL: Valproic Acid Lvl: 79 ug/mL (ref 50–100)

## 2023-10-07 ENCOUNTER — Other Ambulatory Visit: Payer: Self-pay | Admitting: Internal Medicine

## 2023-10-07 DIAGNOSIS — Z1231 Encounter for screening mammogram for malignant neoplasm of breast: Secondary | ICD-10-CM

## 2023-11-22 ENCOUNTER — Ambulatory Visit
Admission: RE | Admit: 2023-11-22 | Discharge: 2023-11-22 | Disposition: A | Discharge status: Home / Self Care | Source: Ambulatory Visit | Attending: Internal Medicine | Admitting: Internal Medicine

## 2023-11-22 DIAGNOSIS — Z1231 Encounter for screening mammogram for malignant neoplasm of breast: Secondary | ICD-10-CM

## 2024-03-24 ENCOUNTER — Other Ambulatory Visit: Payer: Self-pay | Admitting: *Deleted

## 2024-03-24 MED ORDER — LEVETIRACETAM 1000 MG PO TABS: 1000.0000 mg | ORAL_TABLET | Freq: Two times a day (BID) | ORAL | 0 refills | Status: AC

## 2024-03-24 NOTE — Telephone Encounter (Signed)
 Last seen on 04/10/23 Follow up scheduled on 04/20/24

## 2024-04-09 ENCOUNTER — Ambulatory Visit: Payer: 59 | Admitting: Family Medicine

## 2024-04-20 ENCOUNTER — Ambulatory Visit: Admitting: Diagnostic Neuroimaging
